# Patient Record
Sex: Male | Born: 1945 | Race: White | Hispanic: No | Smoking: Former smoker
Health system: Southern US, Community
[De-identification: ages and names within clinical notes are randomized; demographics above are authoritative.]

## PROBLEM LIST (undated history)

## (undated) DIAGNOSIS — M1990 Unspecified osteoarthritis, unspecified site: Secondary | ICD-10-CM

## (undated) DIAGNOSIS — K219 Gastro-esophageal reflux disease without esophagitis: Secondary | ICD-10-CM

## (undated) DIAGNOSIS — I10 Essential (primary) hypertension: Secondary | ICD-10-CM

## (undated) DIAGNOSIS — Z8719 Personal history of other diseases of the digestive system: Secondary | ICD-10-CM

## (undated) DIAGNOSIS — E559 Vitamin D deficiency, unspecified: Secondary | ICD-10-CM

## (undated) DIAGNOSIS — I219 Acute myocardial infarction, unspecified: Secondary | ICD-10-CM

## (undated) DIAGNOSIS — G4730 Sleep apnea, unspecified: Secondary | ICD-10-CM

## (undated) DIAGNOSIS — I2510 Atherosclerotic heart disease of native coronary artery without angina pectoris: Secondary | ICD-10-CM

## (undated) DIAGNOSIS — E119 Type 2 diabetes mellitus without complications: Secondary | ICD-10-CM

## (undated) HISTORY — DX: Atherosclerotic heart disease of native coronary artery without angina pectoris: I25.10

## (undated) HISTORY — PX: CARDIAC CATHETERIZATION: SHX172

## (undated) HISTORY — PX: TONSILLECTOMY: SUR1361

## (undated) HISTORY — DX: Type 2 diabetes mellitus without complications: E11.9

## (undated) HISTORY — PX: SHOULDER ARTHROSCOPY WITH ROTATOR CUFF REPAIR: SHX5685

## (undated) HISTORY — DX: Essential (primary) hypertension: I10

## (undated) HISTORY — DX: Unspecified osteoarthritis, unspecified site: M19.90

## (undated) HISTORY — DX: Vitamin D deficiency, unspecified: E55.9

## (undated) HISTORY — DX: Acute myocardial infarction, unspecified: I21.9

---

## 2020-12-26 ENCOUNTER — Encounter: Payer: Self-pay | Admitting: Surgery

## 2020-12-28 ENCOUNTER — Other Ambulatory Visit: Payer: Self-pay | Admitting: *Deleted

## 2020-12-28 ENCOUNTER — Other Ambulatory Visit: Payer: Self-pay

## 2020-12-28 ENCOUNTER — Encounter: Payer: Self-pay | Admitting: Surgery

## 2020-12-28 ENCOUNTER — Institutional Professional Consult (permissible substitution) (INDEPENDENT_AMBULATORY_CARE_PROVIDER_SITE_OTHER): Payer: No Typology Code available for payment source | Admitting: Surgery

## 2020-12-28 VITALS — BP 109/73 | HR 74 | Resp 20 | Ht 68.0 in | Wt 196.0 lb

## 2020-12-28 DIAGNOSIS — I2510 Atherosclerotic heart disease of native coronary artery without angina pectoris: Secondary | ICD-10-CM | POA: Diagnosis not present

## 2020-12-28 NOTE — Progress Notes (Signed)
Cardiothoracic Surgery Consultation   [facility] Referring Cardiologist is Daryel November, MD  Chief Complaint  Patient presents with  . Coronary Artery Disease    New patient consultation review all studies    HPI:  The patient is a 75 year old gentleman with history of hypertension, type 2 diabetes, and coronary artery disease status post myocardial infarction and remote stenting in 2000 and 2002 although the details of those procedures are unknown.  He now presents with a 69-month history of exertional substernal chest pain that he described as sharp and stabbing and associated with exertion.  This has been progressing over the past several months and occurring on a daily basis.  He was evaluated at [facility] in January with chest pain and ruled out for myocardial infarction.  He was discharged from the emergency room and subsequently had a Cardiolite stress test showing inferior, lateral, and apical ischemia with possible lateral infarct.  He subsequently underwent cardiac catheterization by Dr. Hyacinth Meeker on 12/15/2020.  This showed severe three-vessel coronary disease.  The LAD had mild proximal disease and then was diffusely diseased after the first diagonal branch.  The left circumflex had proximal 99% stenosis at the first marginal branch.  Second marginal was large and had 75 to 80% stenosis.  The right coronary artery was a dominant vessel with 25 to 30% proximal stenosis and distal 75% stenosis.  Left ventriculogram showed low normal left ventricular ejection fraction.  The patient is here today with his brother.  His wife is conferenced and on the telephone.  She is not in the best health and he takes care of her at home.  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  He receives most of his care at the [place] in [place] or [place] and sees Dr. Hyacinth Meeker for his cardiology care.  Past Medical  History:  Diagnosis Date  . Coronary atherosclerosis   . Degenerative joint disease   . Diabetes mellitus without complication (HCC)    type 2  . Hypertension   . Myocardial infarction (HCC)    stents placed  . Vitamin D deficiency     Past Surgical History:  Procedure Laterality Date  . CARDIAC CATHETERIZATION      Family History  Problem Relation Age of Onset  . Heart disease Father   . Diabetes Father   . Stroke Father   . Hypertension Father     Social History Social History   Tobacco Use  . Smoking status: Former Games developer  . Smokeless tobacco: Never Used  Substance Use Topics  . Alcohol use: Not Currently  . Drug use: Never    Current Outpatient Medications  Medication Sig Dispense Refill  . empagliflozin (JARDIANCE) 25 MG TABS tablet Take 25 mg by mouth daily.    Marland Kitchen ezetimibe (ZETIA) 10 MG tablet Take 10 mg by mouth daily.    . insulin aspart (NOVOLOG) 100 UNIT/ML injection Inject 10 Units into the skin 3 (three) times daily before meals.    . insulin glargine (LANTUS) 100 UNIT/ML injection Inject 25 Units into the skin daily. Every AM    . metoprolol tartrate (LOPRESSOR) 25 MG tablet Take 25 mg by mouth 2 (two) times daily.    . metFORMIN (GLUCOPHAGE) 1000 MG tablet Take 1,000 mg by mouth 2 (two) times daily with a meal. (Patient not taking: Reported on 12/28/2020)  No current facility-administered medications for this visit.    No Known Allergies  Review of Systems  Constitutional: Positive for activity change and fatigue.  HENT: Positive for hearing loss.        Has hearing aids  Eyes: Negative.   Respiratory: Positive for shortness of breath.   Cardiovascular: Positive for chest pain. Negative for leg swelling.  Gastrointestinal: Negative.        Hiatal hernia  Endocrine: Negative.   Genitourinary: Negative.   Musculoskeletal: Positive for arthralgias.  Skin: Negative.   Neurological: Negative.  Negative for dizziness and syncope.   Hematological: Negative.   Psychiatric/Behavioral: Negative.     BP 109/73 (BP Location: Left Arm, Patient Position: Sitting)   Pulse 74   Resp 20   Ht 5\' 8"  (1.727 m)   Wt 196 lb (88.9 kg)   SpO2 96% Comment: RA  BMI 29.80 kg/m  Physical Exam Constitutional:      Appearance: Normal appearance. He is normal weight.  HENT:     Head: Normocephalic and atraumatic.     Nose: Nose normal.  Eyes:     Extraocular Movements: Extraocular movements intact.     Conjunctiva/sclera: Conjunctivae normal.     Pupils: Pupils are equal, round, and reactive to light.  Neck:     Vascular: No carotid bruit.  Cardiovascular:     Rate and Rhythm: Normal rate and regular rhythm.     Pulses: Normal pulses.     Heart sounds: Normal heart sounds. No murmur heard.   Pulmonary:     Effort: Pulmonary effort is normal.     Breath sounds: Normal breath sounds.  Abdominal:     General: Abdomen is flat.     Palpations: Abdomen is soft.  Musculoskeletal:        General: No swelling. Normal range of motion.     Cervical back: Normal range of motion and neck supple.  Skin:    General: Skin is warm and dry.  Neurological:     General: No focal deficit present.     Mental Status: He is alert and oriented to person, place, and time.  Psychiatric:        Mood and Affect: Mood normal.        Behavior: Behavior normal.      Diagnostic Tests:  Cardiac catheterization from 12/15/2020 done at [facility] was reviewed on disc  Impression:  This 75 year old gentleman has severe three-vessel coronary disease with progressive anginal symptoms.  His left ventricular function is normal.  Given his history of diabetes and multivessel coronary disease I agree that coronary bypass graft surgery is the best treatment for his coronary disease. I discussed the operative procedure with the patient and his brother including alternatives, benefits and risks; including but not limited to bleeding, blood  transfusion, infection, stroke, myocardial infarction, graft failure, heart block requiring a permanent pacemaker, organ dysfunction, and death.  Novato Grosser understands and agrees to proceed.    Plan:  He will be scheduled for coronary bypass graft surgery on Monday, 01/01/2021.  He has been instructed to stop Metformin after his dose on 12/29/2020.  I spent 60 minutes performing this consultation and > 50% of this time was spent face to face counseling and coordinating the care of this patient's severe multivessel coronary disease.  12/31/2020, MD [facility] [phone]

## 2020-12-29 ENCOUNTER — Encounter (HOSPITAL_COMMUNITY)
Admission: RE | Admit: 2020-12-29 | Discharge: 2020-12-29 | Disposition: A | Discharge status: Home / Self Care | Payer: No Typology Code available for payment source | Source: Ambulatory Visit | Attending: Surgery | Admitting: Surgery

## 2020-12-29 ENCOUNTER — Ambulatory Visit (HOSPITAL_COMMUNITY)
Admission: RE | Admit: 2020-12-29 | Discharge: 2020-12-29 | Disposition: A | Discharge status: Home / Self Care | Payer: No Typology Code available for payment source | Source: Ambulatory Visit | Attending: Surgery | Admitting: Surgery

## 2020-12-29 ENCOUNTER — Ambulatory Visit (HOSPITAL_BASED_OUTPATIENT_CLINIC_OR_DEPARTMENT_OTHER)
Admission: RE | Admit: 2020-12-29 | Discharge: 2020-12-29 | Disposition: A | Discharge status: Home / Self Care | Payer: No Typology Code available for payment source | Source: Ambulatory Visit | Attending: Surgery | Admitting: Surgery

## 2020-12-29 ENCOUNTER — Other Ambulatory Visit (HOSPITAL_COMMUNITY)
Admission: RE | Admit: 2020-12-29 | Discharge: 2020-12-29 | Disposition: A | Discharge status: Home / Self Care | Payer: No Typology Code available for payment source | Source: Ambulatory Visit

## 2020-12-29 ENCOUNTER — Encounter (HOSPITAL_COMMUNITY): Payer: Self-pay | Admitting: Surgery

## 2020-12-29 ENCOUNTER — Other Ambulatory Visit: Payer: Self-pay

## 2020-12-29 ENCOUNTER — Encounter (HOSPITAL_COMMUNITY): Payer: Self-pay

## 2020-12-29 DIAGNOSIS — I10 Essential (primary) hypertension: Secondary | ICD-10-CM | POA: Insufficient documentation

## 2020-12-29 DIAGNOSIS — E119 Type 2 diabetes mellitus without complications: Secondary | ICD-10-CM | POA: Insufficient documentation

## 2020-12-29 DIAGNOSIS — Z01818 Encounter for other preprocedural examination: Secondary | ICD-10-CM | POA: Insufficient documentation

## 2020-12-29 DIAGNOSIS — I2510 Atherosclerotic heart disease of native coronary artery without angina pectoris: Secondary | ICD-10-CM

## 2020-12-29 DIAGNOSIS — I440 Atrioventricular block, first degree: Secondary | ICD-10-CM | POA: Insufficient documentation

## 2020-12-29 DIAGNOSIS — Z20822 Contact with and (suspected) exposure to covid-19: Secondary | ICD-10-CM | POA: Insufficient documentation

## 2020-12-29 HISTORY — DX: Gastro-esophageal reflux disease without esophagitis: K21.9

## 2020-12-29 HISTORY — DX: Sleep apnea, unspecified: G47.30

## 2020-12-29 HISTORY — DX: Personal history of other diseases of the digestive system: Z87.19

## 2020-12-29 LAB — TYPE AND SCREEN
ABO/RH(D): A POS
Antibody Screen: NEGATIVE

## 2020-12-29 LAB — COMPREHENSIVE METABOLIC PANEL
ALT: 15 U/L (ref 0–44)
AST: 17 U/L (ref 15–41)
Albumin: 4 g/dL (ref 3.5–5.0)
Alkaline Phosphatase: 72 U/L (ref 38–126)
Anion gap: 9 (ref 5–15)
BUN: 12 mg/dL (ref 8–23)
CO2: 23 mmol/L (ref 22–32)
Calcium: 9.3 mg/dL (ref 8.9–10.3)
Chloride: 104 mmol/L (ref 98–111)
Creatinine, Ser: 0.6 mg/dL — ABNORMAL LOW (ref 0.61–1.24)
GFR, Estimated: >60 mL/min (ref >60)
Glucose, Bld: 77 mg/dL (ref 70–99)
Potassium: 3.6 mmol/L (ref 3.5–5.1)
Sodium: 136 mmol/L (ref 135–145)
Total Bilirubin: 0.8 mg/dL (ref 0.3–1.2)
Total Protein: 7 g/dL (ref 6.5–8.1)

## 2020-12-29 LAB — URINALYSIS, ROUTINE W REFLEX MICROSCOPIC
Bacteria, UA: NONE SEEN
Bilirubin Urine: NEGATIVE
Glucose, UA: >=500 mg/dL — AB
Hgb urine dipstick: NEGATIVE
Ketones, ur: NEGATIVE mg/dL
Leukocytes,Ua: NEGATIVE
Nitrite: NEGATIVE
Protein, ur: NEGATIVE mg/dL
Specific Gravity, Urine: 1.017 (ref 1.005–1.030)
pH: 6 (ref 5.0–8.0)

## 2020-12-29 LAB — BLOOD GAS, ARTERIAL
Acid-Base Excess: 1.2 mmol/L (ref 0.0–2.0)
Bicarbonate: 25.5 mmol/L (ref 20.0–28.0)
Drawn by: 164
FIO2: 21
O2 Saturation: 97.4 %
Patient temperature: 37
pCO2 arterial: 42.3 mmHg (ref 32.0–48.0)
pH, Arterial: 7.397 (ref 7.350–7.450)
pO2, Arterial: 96.1 mmHg (ref 83.0–108.0)

## 2020-12-29 LAB — CBC
HCT: 42.8 % (ref 39.0–52.0)
Hemoglobin: 13.9 g/dL (ref 13.0–17.0)
MCH: 27.5 pg (ref 26.0–34.0)
MCHC: 32.5 g/dL (ref 30.0–36.0)
MCV: 84.6 fL (ref 80.0–100.0)
Platelets: 367 10*3/uL (ref 150–400)
RBC: 5.06 MIL/uL (ref 4.22–5.81)
RDW: 13.2 % (ref 11.5–15.5)
WBC: 11 10*3/uL — ABNORMAL HIGH (ref 4.0–10.5)
nRBC: 0 % (ref 0.0–0.2)

## 2020-12-29 LAB — HEMOGLOBIN A1C
Hgb A1c MFr Bld: 8.5 % — ABNORMAL HIGH (ref 4.8–5.6)
Mean Plasma Glucose: 197.25 mg/dL

## 2020-12-29 LAB — GLUCOSE, CAPILLARY: Glucose-Capillary: 85 mg/dL (ref 70–99)

## 2020-12-29 LAB — SURGICAL PCR SCREEN
MRSA, PCR: NEGATIVE
Staphylococcus aureus: NEGATIVE

## 2020-12-29 LAB — APTT: aPTT: 33 seconds (ref 24–36)

## 2020-12-29 LAB — PROTIME-INR
INR: 1.1 (ref 0.8–1.2)
Prothrombin Time: 13.4 seconds (ref 11.4–15.2)

## 2020-12-29 MED ORDER — PLASMA-LYTE 148 IV SOLN
INTRAVENOUS | Status: DC
  Filled 2020-12-29: qty 2.5

## 2020-12-29 MED ORDER — SODIUM CHLORIDE 0.9 % IV SOLN
1.5000 g | INTRAVENOUS | Status: AC
  Administered 2021-01-01: 1.5 g via INTRAVENOUS
  Filled 2020-12-29: qty 1.5

## 2020-12-29 MED ORDER — EPINEPHRINE HCL 5 MG/250ML IV SOLN IN NS
0.0000 ug/min | INTRAVENOUS | Status: DC
  Filled 2020-12-29: qty 250

## 2020-12-29 MED ORDER — INSULIN REGULAR(HUMAN) IN NACL 100-0.9 UT/100ML-% IV SOLN
INTRAVENOUS | Status: AC
  Administered 2021-01-01: 1 [IU]/h via INTRAVENOUS
  Filled 2020-12-29: qty 100

## 2020-12-29 MED ORDER — NOREPINEPHRINE 4 MG/250ML-% IV SOLN
0.0000 ug/min | INTRAVENOUS | Status: DC
  Filled 2020-12-29: qty 250

## 2020-12-29 MED ORDER — POTASSIUM CHLORIDE 2 MEQ/ML IV SOLN
80.0000 meq | INTRAVENOUS | Status: DC
  Filled 2020-12-29: qty 40

## 2020-12-29 MED ORDER — NITROGLYCERIN IN D5W 200-5 MCG/ML-% IV SOLN
2.0000 ug/min | INTRAVENOUS | Status: AC
  Administered 2021-01-01: 5 ug/min via INTRAVENOUS
  Filled 2020-12-29: qty 250

## 2020-12-29 MED ORDER — PHENYLEPHRINE HCL-NACL 20-0.9 MG/250ML-% IV SOLN
30.0000 ug/min | INTRAVENOUS | Status: AC
  Administered 2021-01-01: 20 ug/min via INTRAVENOUS
  Filled 2020-12-29: qty 250

## 2020-12-29 MED ORDER — VANCOMYCIN HCL 1500 MG/300ML IV SOLN
1500.0000 mg | INTRAVENOUS | Status: AC
  Administered 2021-01-01: 1500 mg via INTRAVENOUS
  Filled 2020-12-29: qty 300

## 2020-12-29 MED ORDER — SODIUM CHLORIDE 0.9 % IV SOLN
750.0000 mg | INTRAVENOUS | Status: AC
  Administered 2021-01-01: 750 mg via INTRAVENOUS
  Filled 2020-12-29: qty 750

## 2020-12-29 MED ORDER — MAGNESIUM SULFATE 50 % IJ SOLN
40.0000 meq | INTRAMUSCULAR | Status: DC
  Filled 2020-12-29: qty 9.85

## 2020-12-29 MED ORDER — TRANEXAMIC ACID (OHS) PUMP PRIME SOLUTION
2.0000 mg/kg | INTRAVENOUS | Status: DC
  Filled 2020-12-29: qty 1.78

## 2020-12-29 MED ORDER — MILRINONE LACTATE IN DEXTROSE 20-5 MG/100ML-% IV SOLN
0.3000 ug/kg/min | INTRAVENOUS | Status: DC
  Filled 2020-12-29: qty 100

## 2020-12-29 MED ORDER — SODIUM CHLORIDE 0.9 % IV SOLN
INTRAVENOUS | Status: DC
  Filled 2020-12-29: qty 30

## 2020-12-29 MED ORDER — TRANEXAMIC ACID 1000 MG/10ML IV SOLN
1.5000 mg/kg/h | INTRAVENOUS | Status: AC
  Administered 2021-01-01: 1.5 mg/kg/h via INTRAVENOUS
  Filled 2020-12-29: qty 25

## 2020-12-29 MED ORDER — DEXMEDETOMIDINE HCL IN NACL 400 MCG/100ML IV SOLN
0.1000 ug/kg/h | INTRAVENOUS | Status: AC
  Administered 2021-01-01: .4 ug/kg/h via INTRAVENOUS
  Filled 2020-12-29: qty 100

## 2020-12-29 MED ORDER — TRANEXAMIC ACID (OHS) BOLUS VIA INFUSION
15.0000 mg/kg | INTRAVENOUS | Status: AC
  Administered 2021-01-01: 1333.5 mg via INTRAVENOUS
  Filled 2020-12-29: qty 1334

## 2020-12-29 NOTE — Progress Notes (Signed)
Surgical Instructions    Your procedure is scheduled on Monday, March 21st, 2022  Report to [facility] Main Entrance "A" at 05:30 A.M., then check in with the Admitting office.  Call this number if you have problems the morning of surgery:  [phone]   If you have any questions prior to your surgery date call [phone]: Open Monday-Friday 8am-4pm    Remember:  Do not eat or drink after midnight the night before your surgery     Take these medicines the morning of surgery with A SIP OF WATER   acetaminophen (TYLENOL)  ezetimibe (ZETIA) metoprolol tartrate (LOPRESSOR) omeprazole (PRILOSEC) amoxicillin (AMOXIL)     Take this medicines if needed the morning of surgery  nitroGLYCERIN (NITROSTAT)-please let a nurse know if you had to use this.   Follow your surgeon's instructions on when to stop Aspirin.  If no instructions were given by your surgeon then you will need to call the office to get those instructions.     As of today, STOP taking Aleve, Naproxen, Ibuprofen, Motrin, Advil, Goody's, BC's, all herbal medications, fish oil, and all vitamins.  . WHAT DO I DO ABOUT MY DIABETES MEDICATION?   Marland Kitchen Do not take empagliflozin (JARDIANCE) the day before surgery and the day of surgery.   . Do not take metFORMIN (GLUCOPHAGE) on the day of surgery  . THE NIGHT BEFORE SURGERY, take _9__units of insulin aspart protamine- aspart (NOVOLOG MIX 70/30).      . THE MORNING OF SURGERY, take _26_ units of insulin glargine (LANTUS).   HOW TO MANAGE YOUR DIABETES BEFORE AND AFTER SURGERY  Why is it important to control my blood sugar before and after surgery? . Improving blood sugar levels before and after surgery helps healing and can limit problems. . A way of improving blood sugar control is eating a healthy diet by: o  Eating less sugar and carbohydrates o  Increasing activity/exercise o  Talking with your doctor about reaching your blood sugar goals . High blood sugars  (greater than 180 mg/dL) can raise your risk of infections and slow your recovery, so you will need to focus on controlling your diabetes during the weeks before surgery. . Make sure that the doctor who takes care of your diabetes knows about your planned surgery including the date and location.  How do I manage my blood sugar before surgery? . Check your blood sugar at least 4 times a day, starting 2 days before surgery, to make sure that the level is not too high or low. . Check your blood sugar the morning of your surgery when you wake up and every 2 hours until you get to the Short Stay unit. o If your blood sugar is less than 70 mg/dL, you will need to treat for low blood sugar: - Do not take insulin. - Treat a low blood sugar (less than 70 mg/dL) with  cup of clear juice (cranberry or apple), 4 glucose tablets, OR glucose gel. - Recheck blood sugar in 15 minutes after treatment (to make sure it is greater than 70 mg/dL). If your blood sugar is not greater than 70 mg/dL on recheck, call [phone] for further instructions. . Report your blood sugar to the short stay nurse when you get to Short Stay.  . If you are admitted to the hospital after surgery: o Your blood sugar will be checked by the staff and you will probably be given insulin after surgery (instead of oral diabetes medicines) to make sure  you have good blood sugar levels. o The goal for blood sugar control after surgery is 80-180 mg/dL.                      Do not wear jewelry.             Do not wear lotions, powders, colognes, or deodorant.            Men may shave face and neck.            Do not bring valuables to the hospital.            [facility] is not responsible for any belongings or valuables.  Do NOT Smoke (Tobacco/Vaping) or drink Alcohol 24 hours prior to your procedure If you use a CPAP at night, you may bring all equipment for your overnight stay.   Contacts, glasses, dentures or bridgework may not be  worn into surgery, please bring cases for these belongings   For patients admitted to the hospital, discharge time will be determined by your treatment team.   Patients discharged the day of surgery will not be allowed to drive home, and someone needs to stay with them for 24 hours.    Special instructions:   [place]- Preparing For Surgery  Before surgery, you can play an important role. Because skin is not sterile, your skin needs to be as free of germs as possible. You can reduce the number of germs on your skin by washing with CHG (chlorahexidine gluconate) Soap before surgery.  CHG is an antiseptic cleaner which kills germs and bonds with the skin to continue killing germs even after washing.    Oral Hygiene is also important to reduce your risk of infection.  Remember - BRUSH YOUR TEETH THE MORNING OF SURGERY WITH YOUR REGULAR TOOTHPASTE  Please do not use if you have an allergy to CHG or antibacterial soaps. If your skin becomes reddened/irritated stop using the CHG.  Do not shave (including legs and underarms) for at least 48 hours prior to first CHG shower. It is OK to shave your face.  Please follow these instructions carefully.   1. Shower the NIGHT BEFORE SURGERY and the MORNING OF SURGERY  2. If you chose to wash your hair, wash your hair first as usual with your normal shampoo.  3. After you shampoo, rinse your hair and body thoroughly to remove the shampoo.  4. Use CHG Soap as you would any other liquid soap. You can apply CHG directly to the skin and wash gently with a scrungie or a clean washcloth.   5. Apply the CHG Soap to your body ONLY FROM THE NECK DOWN.  Do not use on open wounds or open sores. Avoid contact with your eyes, ears, mouth and genitals (private parts). Wash Face and genitals (private parts)  with your normal soap.   6. Wash thoroughly, paying special attention to the area where your surgery will be performed.  7. Thoroughly rinse your body with  warm water from the neck down.  8. DO NOT shower/wash with your normal soap after using and rinsing off the CHG Soap.  9. Pat yourself dry with a CLEAN TOWEL.  10. Wear CLEAN PAJAMAS to bed the night before surgery  11. Place CLEAN SHEETS on your bed the night before your surgery  12. DO NOT SLEEP WITH PETS.   Day of Surgery: Shower with CHG soap. Wear Clean/Comfortable clothing the morning of surgery Do  not apply any deodorants/lotions.   Remember to brush your teeth WITH YOUR REGULAR TOOTHPASTE.   Please read over the following fact sheets that you were given.

## 2020-12-29 NOTE — Progress Notes (Signed)
[facility]P [facility] [place], [place] Cardiologist - Dr. Daryel November  Chest x-ray - 12/29/20 EKG - 12/29/20 Stress Test - 11/2020 ECHO - pt unsure of exact date Cardiac Cath -12/15/20-[facility]   Sleep Study - OSA+ CPAP - uses nightly  Fasting Blood Sugar - 90-100 Checks Blood Sugar 3 times a day  Blood Thinner Instructions: n/a Aspirin Instructions: continue; none DOS  COVID TEST- 12/29/20 in PAT; pt will quarantine until surgery.  Anesthesia review: Yes, heart history.  Patient denies shortness of breath, fever, cough and chest pain at PAT appointment   All instructions explained to the patient, with a verbal understanding of the material. Patient agrees to go over the instructions while at home for a better understanding. Patient also instructed to self quarantine after being tested for COVID-19. The opportunity to ask questions was provided.

## 2020-12-29 NOTE — Progress Notes (Signed)
Surgical Instructions    Your procedure is scheduled on Monday, March 21st, 2022  Report to [facility] Main Entrance "A" at 05:30 A.M., then check in with the Admitting office.  Call this number if you have problems the morning of surgery:  [phone]   If you have any questions prior to your surgery date call [phone]: Open Monday-Friday 8am-4pm    Remember:  Do not eat or drink after midnight the night before your surgery     Take these medicines the morning of surgery with A SIP OF WATER   acetaminophen (TYLENOL)  ezetimibe (ZETIA) metoprolol tartrate (LOPRESSOR) omeprazole (PRILOSEC) amoxicillin (AMOXIL)  losartan (COZAAR)    Take this medicines if needed the morning of surgery  nitroGLYCERIN (NITROSTAT)   Follow your surgeon's instructions on when to stop Aspirin.  If no instructions were given by your surgeon then you will need to call the office to get those instructions.     As of today, STOP taking Aleve, Naproxen, Ibuprofen, Motrin, Advil, Goody's, BC's, all herbal medications, fish oil, and all vitamins.  . WHAT DO I DO ABOUT MY DIABETES MEDICATION?   Marland Kitchen Do not take empagliflozin (JARDIANCE) the day before surgery and the day of surgery.   . Do not take metFORMIN (GLUCOPHAGE) on the day of surgery  . THE NIGHT BEFORE SURGERY, take _9__units of insulin aspart protamine- aspart (NOVOLOG MIX 70/30).     . THE MORNING BEFORE SURGERY AND THE MORNING OF SURGERY, take _26_ units of insulin glargine (LANTUS).  . The day of surgery, do not take other diabetes injectables, including Byetta (exenatide), Bydureon (exenatide ER), Victoza (liraglutide), or Trulicity (dulaglutide).  . If your CBG is greater than 220 mg/dL, you may take  of your sliding scale (correction) dose of insulin.   HOW TO MANAGE YOUR DIABETES BEFORE AND AFTER SURGERY  Why is it important to control my blood sugar before and after surgery? . Improving blood sugar levels before and after  surgery helps healing and can limit problems. . A way of improving blood sugar control is eating a healthy diet by: o  Eating less sugar and carbohydrates o  Increasing activity/exercise o  Talking with your doctor about reaching your blood sugar goals . High blood sugars (greater than 180 mg/dL) can raise your risk of infections and slow your recovery, so you will need to focus on controlling your diabetes during the weeks before surgery. . Make sure that the doctor who takes care of your diabetes knows about your planned surgery including the date and location.  How do I manage my blood sugar before surgery? . Check your blood sugar at least 4 times a day, starting 2 days before surgery, to make sure that the level is not too high or low. . Check your blood sugar the morning of your surgery when you wake up and every 2 hours until you get to the Short Stay unit. o If your blood sugar is less than 70 mg/dL, you will need to treat for low blood sugar: - Do not take insulin. - Treat a low blood sugar (less than 70 mg/dL) with  cup of clear juice (cranberry or apple), 4 glucose tablets, OR glucose gel. - Recheck blood sugar in 15 minutes after treatment (to make sure it is greater than 70 mg/dL). If your blood sugar is not greater than 70 mg/dL on recheck, call [phone] for further instructions. . Report your blood sugar to the short stay nurse when you get  to Short Stay.  . If you are admitted to the hospital after surgery: o Your blood sugar will be checked by the staff and you will probably be given insulin after surgery (instead of oral diabetes medicines) to make sure you have good blood sugar levels. o The goal for blood sugar control after surgery is 80-180 mg/dL.                      Do not wear jewelry, make up, or nail polish            Do not wear lotions, powders, perfumes, or deodorant.            Men may shave face and neck.            Do not bring valuables to the  hospital.            [facility] is not responsible for any belongings or valuables.  Do NOT Smoke (Tobacco/Vaping) or drink Alcohol 24 hours prior to your procedure If you use a CPAP at night, you may bring all equipment for your overnight stay.   Contacts, glasses, dentures or bridgework may not be worn into surgery, please bring cases for these belongings   For patients admitted to the hospital, discharge time will be determined by your treatment team.   Patients discharged the day of surgery will not be allowed to drive home, and someone needs to stay with them for 24 hours.    Special instructions:   [place]- Preparing For Surgery  Before surgery, you can play an important role. Because skin is not sterile, your skin needs to be as free of germs as possible. You can reduce the number of germs on your skin by washing with CHG (chlorahexidine gluconate) Soap before surgery.  CHG is an antiseptic cleaner which kills germs and bonds with the skin to continue killing germs even after washing.    Oral Hygiene is also important to reduce your risk of infection.  Remember - BRUSH YOUR TEETH THE MORNING OF SURGERY WITH YOUR REGULAR TOOTHPASTE  Please do not use if you have an allergy to CHG or antibacterial soaps. If your skin becomes reddened/irritated stop using the CHG.  Do not shave (including legs and underarms) for at least 48 hours prior to first CHG shower. It is OK to shave your face.  Please follow these instructions carefully.   1. Shower the NIGHT BEFORE SURGERY and the MORNING OF SURGERY  2. If you chose to wash your hair, wash your hair first as usual with your normal shampoo.  3. After you shampoo, rinse your hair and body thoroughly to remove the shampoo.  4. Use CHG Soap as you would any other liquid soap. You can apply CHG directly to the skin and wash gently with a scrungie or a clean washcloth.   5. Apply the CHG Soap to your body ONLY FROM THE NECK DOWN.  Do not  use on open wounds or open sores. Avoid contact with your eyes, ears, mouth and genitals (private parts). Wash Face and genitals (private parts)  with your normal soap.   6. Wash thoroughly, paying special attention to the area where your surgery will be performed.  7. Thoroughly rinse your body with warm water from the neck down.  8. DO NOT shower/wash with your normal soap after using and rinsing off the CHG Soap.  9. Pat yourself dry with a CLEAN TOWEL.  10. Wear  CLEAN PAJAMAS to bed the night before surgery  11. Place CLEAN SHEETS on your bed the night before your surgery  12. DO NOT SLEEP WITH PETS.   Day of Surgery: Shower with CHG soap. Wear Clean/Comfortable clothing the morning of surgery Do not apply any deodorants/lotions.   Remember to brush your teeth WITH YOUR REGULAR TOOTHPASTE.   Please read over the following fact sheets that you were given.

## 2020-12-30 LAB — SARS CORONAVIRUS 2 (TAT 6-24 HRS)
SARS Coronavirus 2: NEGATIVE
SARS Coronavirus 2: NEGATIVE

## 2020-12-31 NOTE — Anesthesia Preprocedure Evaluation (Addendum)
Anesthesia Evaluation  Patient identified by MRN, date of birth, ID band Patient awake    Reviewed: Allergy & Precautions, NPO status , Patient's Chart, lab work & pertinent test results, reviewed documented beta blocker date and time   Airway Mallampati: II  TM Distance: <3 FB Neck ROM: Full    Dental  (+) Dental Advisory Given, Edentulous Lower, Poor Dentition   Pulmonary sleep apnea and Continuous Positive Airway Pressure Ventilation , former smoker,    Pulmonary exam normal breath sounds clear to auscultation       Cardiovascular hypertension, Pt. on home beta blockers and Pt. on medications + CAD, + Past MI and + Cardiac Stents  Normal cardiovascular exam Rhythm:Regular Rate:Normal     Neuro/Psych negative neurological ROS     GI/Hepatic Neg liver ROS, hiatal hernia, GERD  Medicated,  Endo/Other  diabetes, Type 2, Oral Hypoglycemic Agents, Insulin Dependent  Renal/GU negative Renal ROS     Musculoskeletal  (+) Arthritis ,   Abdominal   Peds  Hematology negative hematology ROS (+)   Anesthesia Other Findings   Reproductive/Obstetrics                            Anesthesia Physical Anesthesia Plan  ASA: IV  Anesthesia Plan: General   Post-op Pain Management:    Induction: Intravenous  PONV Risk Score and Plan: 2 and Treatment may vary due to age or medical condition and Midazolam  Airway Management Planned: Oral ETT and Video Laryngoscope Planned  Additional Equipment: Arterial line, CVP, TEE and Ultrasound Guidance Line Placement  Intra-op Plan:   Post-operative Plan: Post-operative intubation/ventilation  Informed Consent: I have reviewed the patients History and Physical, chart, labs and discussed the procedure including the risks, benefits and alternatives for the proposed anesthesia with the patient or authorized representative who has indicated his/her understanding and  acceptance.     Dental advisory given  Plan Discussed with: CRNA  Anesthesia Plan Comments:        Anesthesia Quick Evaluation

## 2021-01-01 ENCOUNTER — Inpatient Hospital Stay (HOSPITAL_COMMUNITY): Payer: No Typology Code available for payment source

## 2021-01-01 ENCOUNTER — Inpatient Hospital Stay (HOSPITAL_COMMUNITY): Payer: No Typology Code available for payment source | Admitting: Certified Registered Nurse Anesthetist

## 2021-01-01 ENCOUNTER — Encounter (HOSPITAL_COMMUNITY): Payer: Self-pay | Admitting: Surgery

## 2021-01-01 ENCOUNTER — Encounter: Payer: Self-pay | Admitting: Internal Medicine

## 2021-01-01 ENCOUNTER — Inpatient Hospital Stay (HOSPITAL_COMMUNITY)
Admission: RE | Admit: 2021-01-01 | Discharge: 2021-01-06 | DRG: 236 | Disposition: A | Discharge status: Home / Self Care | Payer: No Typology Code available for payment source | Attending: Surgery | Admitting: Surgery

## 2021-01-01 ENCOUNTER — Inpatient Hospital Stay (HOSPITAL_COMMUNITY)
Admission: RE | Disposition: A | Discharge status: Home / Self Care | Payer: Self-pay | Source: Home / Self Care | Attending: Surgery

## 2021-01-01 ENCOUNTER — Other Ambulatory Visit: Payer: Self-pay

## 2021-01-01 DIAGNOSIS — I4891 Unspecified atrial fibrillation: Secondary | ICD-10-CM | POA: Diagnosis not present

## 2021-01-01 DIAGNOSIS — E119 Type 2 diabetes mellitus without complications: Secondary | ICD-10-CM | POA: Diagnosis present

## 2021-01-01 DIAGNOSIS — E559 Vitamin D deficiency, unspecified: Secondary | ICD-10-CM | POA: Diagnosis present

## 2021-01-01 DIAGNOSIS — Z823 Family history of stroke: Secondary | ICD-10-CM

## 2021-01-01 DIAGNOSIS — I97190 Other postprocedural cardiac functional disturbances following cardiac surgery: Secondary | ICD-10-CM | POA: Diagnosis not present

## 2021-01-01 DIAGNOSIS — Y838 Other surgical procedures as the cause of abnormal reaction of the patient, or of later complication, without mention of misadventure at the time of the procedure: Secondary | ICD-10-CM | POA: Diagnosis not present

## 2021-01-01 DIAGNOSIS — Z79899 Other long term (current) drug therapy: Secondary | ICD-10-CM | POA: Diagnosis not present

## 2021-01-01 DIAGNOSIS — J9811 Atelectasis: Secondary | ICD-10-CM | POA: Diagnosis not present

## 2021-01-01 DIAGNOSIS — Z7984 Long term (current) use of oral hypoglycemic drugs: Secondary | ICD-10-CM | POA: Diagnosis not present

## 2021-01-01 DIAGNOSIS — G4730 Sleep apnea, unspecified: Secondary | ICD-10-CM | POA: Diagnosis present

## 2021-01-01 DIAGNOSIS — I252 Old myocardial infarction: Secondary | ICD-10-CM

## 2021-01-01 DIAGNOSIS — Z833 Family history of diabetes mellitus: Secondary | ICD-10-CM | POA: Diagnosis not present

## 2021-01-01 DIAGNOSIS — Z8249 Family history of ischemic heart disease and other diseases of the circulatory system: Secondary | ICD-10-CM | POA: Diagnosis not present

## 2021-01-01 DIAGNOSIS — D62 Acute posthemorrhagic anemia: Secondary | ICD-10-CM | POA: Diagnosis not present

## 2021-01-01 DIAGNOSIS — Z951 Presence of aortocoronary bypass graft: Secondary | ICD-10-CM

## 2021-01-01 DIAGNOSIS — K219 Gastro-esophageal reflux disease without esophagitis: Secondary | ICD-10-CM | POA: Diagnosis present

## 2021-01-01 DIAGNOSIS — I440 Atrioventricular block, first degree: Secondary | ICD-10-CM | POA: Diagnosis not present

## 2021-01-01 DIAGNOSIS — Z955 Presence of coronary angioplasty implant and graft: Secondary | ICD-10-CM

## 2021-01-01 DIAGNOSIS — M1990 Unspecified osteoarthritis, unspecified site: Secondary | ICD-10-CM | POA: Diagnosis present

## 2021-01-01 DIAGNOSIS — Z20822 Contact with and (suspected) exposure to covid-19: Secondary | ICD-10-CM | POA: Diagnosis present

## 2021-01-01 DIAGNOSIS — Z87891 Personal history of nicotine dependence: Secondary | ICD-10-CM | POA: Diagnosis not present

## 2021-01-01 DIAGNOSIS — I10 Essential (primary) hypertension: Secondary | ICD-10-CM | POA: Diagnosis present

## 2021-01-01 DIAGNOSIS — H9190 Unspecified hearing loss, unspecified ear: Secondary | ICD-10-CM | POA: Diagnosis present

## 2021-01-01 DIAGNOSIS — Z794 Long term (current) use of insulin: Secondary | ICD-10-CM | POA: Diagnosis not present

## 2021-01-01 DIAGNOSIS — J90 Pleural effusion, not elsewhere classified: Secondary | ICD-10-CM

## 2021-01-01 DIAGNOSIS — I2510 Atherosclerotic heart disease of native coronary artery without angina pectoris: Principal | ICD-10-CM | POA: Diagnosis present

## 2021-01-01 DIAGNOSIS — E8770 Fluid overload, unspecified: Secondary | ICD-10-CM | POA: Diagnosis not present

## 2021-01-01 DIAGNOSIS — J939 Pneumothorax, unspecified: Secondary | ICD-10-CM

## 2021-01-01 HISTORY — PX: CORONARY ARTERY BYPASS GRAFT: SHX141

## 2021-01-01 HISTORY — PX: TEE WITHOUT CARDIOVERSION: SHX5443

## 2021-01-01 LAB — POCT I-STAT 7, (LYTES, BLD GAS, ICA,H+H)
Acid-Base Excess: 3 mmol/L — ABNORMAL HIGH (ref 0.0–2.0)
Acid-Base Excess: 3 mmol/L — ABNORMAL HIGH (ref 0.0–2.0)
Acid-Base Excess: 2 mmol/L (ref 0.0–2.0)
Acid-Base Excess: 1 mmol/L (ref 0.0–2.0)
Acid-Base Excess: 0 mmol/L (ref 0.0–2.0)
Acid-base deficit: 4 mmol/L — ABNORMAL HIGH (ref 0.0–2.0)
Acid-base deficit: 4 mmol/L — ABNORMAL HIGH (ref 0.0–2.0)
Bicarbonate: 28.6 mmol/L — ABNORMAL HIGH (ref 20.0–28.0)
Bicarbonate: 28.5 mmol/L — ABNORMAL HIGH (ref 20.0–28.0)
Bicarbonate: 27 mmol/L (ref 20.0–28.0)
Bicarbonate: 25.4 mmol/L (ref 20.0–28.0)
Bicarbonate: 25 mmol/L (ref 20.0–28.0)
Bicarbonate: 23.1 mmol/L (ref 20.0–28.0)
Bicarbonate: 23.1 mmol/L (ref 20.0–28.0)
Calcium, Ion: 1.24 mmol/L (ref 1.15–1.40)
Calcium, Ion: 0.97 mmol/L — ABNORMAL LOW (ref 1.15–1.40)
Calcium, Ion: 1.12 mmol/L — ABNORMAL LOW (ref 1.15–1.40)
Calcium, Ion: 1.07 mmol/L — ABNORMAL LOW (ref 1.15–1.40)
Calcium, Ion: 1.16 mmol/L (ref 1.15–1.40)
Calcium, Ion: 1.12 mmol/L — ABNORMAL LOW (ref 1.15–1.40)
Calcium, Ion: 1.11 mmol/L — ABNORMAL LOW (ref 1.15–1.40)
HCT: 40 % (ref 39.0–52.0)
HCT: 28 % — ABNORMAL LOW (ref 39.0–52.0)
HCT: 30 % — ABNORMAL LOW (ref 39.0–52.0)
HCT: 32 % — ABNORMAL LOW (ref 39.0–52.0)
HCT: 35 % — ABNORMAL LOW (ref 39.0–52.0)
HCT: 33 % — ABNORMAL LOW (ref 39.0–52.0)
HCT: 31 % — ABNORMAL LOW (ref 39.0–52.0)
Hemoglobin: 13.6 g/dL (ref 13.0–17.0)
Hemoglobin: 9.5 g/dL — ABNORMAL LOW (ref 13.0–17.0)
Hemoglobin: 10.2 g/dL — ABNORMAL LOW (ref 13.0–17.0)
Hemoglobin: 10.9 g/dL — ABNORMAL LOW (ref 13.0–17.0)
Hemoglobin: 11.9 g/dL — ABNORMAL LOW (ref 13.0–17.0)
Hemoglobin: 11.2 g/dL — ABNORMAL LOW (ref 13.0–17.0)
Hemoglobin: 10.5 g/dL — ABNORMAL LOW (ref 13.0–17.0)
O2 Saturation: 100 %
O2 Saturation: 100 %
O2 Saturation: 100 %
O2 Saturation: 98 %
O2 Saturation: 99 %
O2 Saturation: 98 %
O2 Saturation: 96 %
Patient temperature: 36.4
Patient temperature: 37.6
Patient temperature: 37.7
Potassium: 4.2 mmol/L (ref 3.5–5.1)
Potassium: 4.2 mmol/L (ref 3.5–5.1)
Potassium: 5 mmol/L (ref 3.5–5.1)
Potassium: 4.6 mmol/L (ref 3.5–5.1)
Potassium: 3.7 mmol/L (ref 3.5–5.1)
Potassium: 5.1 mmol/L (ref 3.5–5.1)
Potassium: 4.8 mmol/L (ref 3.5–5.1)
Sodium: 138 mmol/L (ref 135–145)
Sodium: 141 mmol/L (ref 135–145)
Sodium: 139 mmol/L (ref 135–145)
Sodium: 140 mmol/L (ref 135–145)
Sodium: 141 mmol/L (ref 135–145)
Sodium: 140 mmol/L (ref 135–145)
Sodium: 139 mmol/L (ref 135–145)
TCO2: 30 mmol/L (ref 22–32)
TCO2: 30 mmol/L (ref 22–32)
TCO2: 28 mmol/L (ref 22–32)
TCO2: 27 mmol/L (ref 22–32)
TCO2: 26 mmol/L (ref 22–32)
TCO2: 25 mmol/L (ref 22–32)
TCO2: 24 mmol/L (ref 22–32)
pCO2 arterial: 47.9 mmHg (ref 32.0–48.0)
pCO2 arterial: 46.6 mmHg (ref 32.0–48.0)
pCO2 arterial: 42.3 mmHg (ref 32.0–48.0)
pCO2 arterial: 38.2 mmHg (ref 32.0–48.0)
pCO2 arterial: 42.3 mmHg (ref 32.0–48.0)
pCO2 arterial: 48.7 mmHg — ABNORMAL HIGH (ref 32.0–48.0)
pCO2 arterial: 48.9 mmHg — ABNORMAL HIGH (ref 32.0–48.0)
pH, Arterial: 7.385 (ref 7.350–7.450)
pH, Arterial: 7.395 (ref 7.350–7.450)
pH, Arterial: 7.413 (ref 7.350–7.450)
pH, Arterial: 7.431 (ref 7.350–7.450)
pH, Arterial: 7.377 (ref 7.350–7.450)
pH, Arterial: 7.287 — ABNORMAL LOW (ref 7.350–7.450)
pH, Arterial: 7.285 — ABNORMAL LOW (ref 7.350–7.450)
pO2, Arterial: 519 mmHg — ABNORMAL HIGH (ref 83.0–108.0)
pO2, Arterial: 375 mmHg — ABNORMAL HIGH (ref 83.0–108.0)
pO2, Arterial: 352 mmHg — ABNORMAL HIGH (ref 83.0–108.0)
pO2, Arterial: 101 mmHg (ref 83.0–108.0)
pO2, Arterial: 149 mmHg — ABNORMAL HIGH (ref 83.0–108.0)
pO2, Arterial: 117 mmHg — ABNORMAL HIGH (ref 83.0–108.0)
pO2, Arterial: 99 mmHg (ref 83.0–108.0)

## 2021-01-01 LAB — CBC
HCT: 36.8 % — ABNORMAL LOW (ref 39.0–52.0)
HCT: 34 % — ABNORMAL LOW (ref 39.0–52.0)
Hemoglobin: 12 g/dL — ABNORMAL LOW (ref 13.0–17.0)
Hemoglobin: 10.8 g/dL — ABNORMAL LOW (ref 13.0–17.0)
MCH: 27.4 pg (ref 26.0–34.0)
MCH: 27.3 pg (ref 26.0–34.0)
MCHC: 32.6 g/dL (ref 30.0–36.0)
MCHC: 31.8 g/dL (ref 30.0–36.0)
MCV: 84 fL (ref 80.0–100.0)
MCV: 85.9 fL (ref 80.0–100.0)
Platelets: 207 10*3/uL (ref 150–400)
Platelets: 230 10*3/uL (ref 150–400)
RBC: 4.38 MIL/uL (ref 4.22–5.81)
RBC: 3.96 MIL/uL — ABNORMAL LOW (ref 4.22–5.81)
RDW: 13.2 % (ref 11.5–15.5)
RDW: 13.2 % (ref 11.5–15.5)
WBC: 15.5 10*3/uL — ABNORMAL HIGH (ref 4.0–10.5)
WBC: 17.3 10*3/uL — ABNORMAL HIGH (ref 4.0–10.5)
nRBC: 0 % (ref 0.0–0.2)
nRBC: 0 % (ref 0.0–0.2)

## 2021-01-01 LAB — POCT I-STAT, CHEM 8
BUN: 17 mg/dL (ref 8–23)
BUN: 16 mg/dL (ref 8–23)
BUN: 14 mg/dL (ref 8–23)
BUN: 15 mg/dL (ref 8–23)
BUN: 15 mg/dL (ref 8–23)
BUN: 19 mg/dL (ref 8–23)
Calcium, Ion: 1.22 mmol/L (ref 1.15–1.40)
Calcium, Ion: 1.21 mmol/L (ref 1.15–1.40)
Calcium, Ion: 1.12 mmol/L — ABNORMAL LOW (ref 1.15–1.40)
Calcium, Ion: 1.14 mmol/L — ABNORMAL LOW (ref 1.15–1.40)
Calcium, Ion: 1.09 mmol/L — ABNORMAL LOW (ref 1.15–1.40)
Calcium, Ion: 1.07 mmol/L — ABNORMAL LOW (ref 1.15–1.40)
Chloride: 101 mmol/L (ref 98–111)
Chloride: 101 mmol/L (ref 98–111)
Chloride: 100 mmol/L (ref 98–111)
Chloride: 102 mmol/L (ref 98–111)
Chloride: 101 mmol/L (ref 98–111)
Chloride: 101 mmol/L (ref 98–111)
Creatinine, Ser: 0.4 mg/dL — ABNORMAL LOW (ref 0.61–1.24)
Creatinine, Ser: 0.5 mg/dL — ABNORMAL LOW (ref 0.61–1.24)
Creatinine, Ser: 0.3 mg/dL — ABNORMAL LOW (ref 0.61–1.24)
Creatinine, Ser: 0.4 mg/dL — ABNORMAL LOW (ref 0.61–1.24)
Creatinine, Ser: 0.5 mg/dL — ABNORMAL LOW (ref 0.61–1.24)
Creatinine, Ser: 0.4 mg/dL — ABNORMAL LOW (ref 0.61–1.24)
Glucose, Bld: 147 mg/dL — ABNORMAL HIGH (ref 70–99)
Glucose, Bld: 103 mg/dL — ABNORMAL HIGH (ref 70–99)
Glucose, Bld: 105 mg/dL — ABNORMAL HIGH (ref 70–99)
Glucose, Bld: 83 mg/dL (ref 70–99)
Glucose, Bld: 136 mg/dL — ABNORMAL HIGH (ref 70–99)
Glucose, Bld: 112 mg/dL — ABNORMAL HIGH (ref 70–99)
HCT: 41 % (ref 39.0–52.0)
HCT: 36 % — ABNORMAL LOW (ref 39.0–52.0)
HCT: 30 % — ABNORMAL LOW (ref 39.0–52.0)
HCT: 31 % — ABNORMAL LOW (ref 39.0–52.0)
HCT: 31 % — ABNORMAL LOW (ref 39.0–52.0)
HCT: 32 % — ABNORMAL LOW (ref 39.0–52.0)
Hemoglobin: 13.9 g/dL (ref 13.0–17.0)
Hemoglobin: 12.2 g/dL — ABNORMAL LOW (ref 13.0–17.0)
Hemoglobin: 10.2 g/dL — ABNORMAL LOW (ref 13.0–17.0)
Hemoglobin: 10.5 g/dL — ABNORMAL LOW (ref 13.0–17.0)
Hemoglobin: 10.5 g/dL — ABNORMAL LOW (ref 13.0–17.0)
Hemoglobin: 10.9 g/dL — ABNORMAL LOW (ref 13.0–17.0)
Potassium: 4.2 mmol/L (ref 3.5–5.1)
Potassium: 4 mmol/L (ref 3.5–5.1)
Potassium: 4.3 mmol/L (ref 3.5–5.1)
Potassium: 4.6 mmol/L (ref 3.5–5.1)
Potassium: 4.5 mmol/L (ref 3.5–5.1)
Potassium: 4.6 mmol/L (ref 3.5–5.1)
Sodium: 139 mmol/L (ref 135–145)
Sodium: 139 mmol/L (ref 135–145)
Sodium: 138 mmol/L (ref 135–145)
Sodium: 139 mmol/L (ref 135–145)
Sodium: 138 mmol/L (ref 135–145)
Sodium: 140 mmol/L (ref 135–145)
TCO2: 28 mmol/L (ref 22–32)
TCO2: 25 mmol/L (ref 22–32)
TCO2: 26 mmol/L (ref 22–32)
TCO2: 27 mmol/L (ref 22–32)
TCO2: 27 mmol/L (ref 22–32)
TCO2: 26 mmol/L (ref 22–32)

## 2021-01-01 LAB — COMPREHENSIVE METABOLIC PANEL
ALT: 14 U/L (ref 0–44)
AST: 24 U/L (ref 15–41)
Albumin: 3.6 g/dL (ref 3.5–5.0)
Alkaline Phosphatase: 54 U/L (ref 38–126)
Anion gap: 6 (ref 5–15)
BUN: 14 mg/dL (ref 8–23)
CO2: 22 mmol/L (ref 22–32)
Calcium: 7.5 mg/dL — ABNORMAL LOW (ref 8.9–10.3)
Chloride: 107 mmol/L (ref 98–111)
Creatinine, Ser: 0.67 mg/dL (ref 0.61–1.24)
GFR, Estimated: >60 mL/min (ref >60)
Glucose, Bld: 162 mg/dL — ABNORMAL HIGH (ref 70–99)
Potassium: 5 mmol/L (ref 3.5–5.1)
Sodium: 135 mmol/L (ref 135–145)
Total Bilirubin: 1.2 mg/dL (ref 0.3–1.2)
Total Protein: 5.6 g/dL — ABNORMAL LOW (ref 6.5–8.1)

## 2021-01-01 LAB — POCT I-STAT EG7
Acid-Base Excess: 2 mmol/L (ref 0.0–2.0)
Bicarbonate: 27.7 mmol/L (ref 20.0–28.0)
Calcium, Ion: 1.09 mmol/L — ABNORMAL LOW (ref 1.15–1.40)
HCT: 29 % — ABNORMAL LOW (ref 39.0–52.0)
Hemoglobin: 9.9 g/dL — ABNORMAL LOW (ref 13.0–17.0)
O2 Saturation: 83 %
Potassium: 4.6 mmol/L (ref 3.5–5.1)
Sodium: 139 mmol/L (ref 135–145)
TCO2: 29 mmol/L (ref 22–32)
pCO2, Ven: 48 mmHg (ref 44.0–60.0)
pH, Ven: 7.369 (ref 7.250–7.430)
pO2, Ven: 50 mmHg — ABNORMAL HIGH (ref 32.0–45.0)

## 2021-01-01 LAB — HEMOGLOBIN AND HEMATOCRIT, BLOOD
HCT: 30.7 % — ABNORMAL LOW (ref 39.0–52.0)
Hemoglobin: 10.1 g/dL — ABNORMAL LOW (ref 13.0–17.0)

## 2021-01-01 LAB — GLUCOSE, CAPILLARY
Glucose-Capillary: 73 mg/dL (ref 70–99)
Glucose-Capillary: 61 mg/dL — ABNORMAL LOW (ref 70–99)
Glucose-Capillary: 107 mg/dL — ABNORMAL HIGH (ref 70–99)
Glucose-Capillary: 81 mg/dL (ref 70–99)
Glucose-Capillary: 53 mg/dL — ABNORMAL LOW (ref 70–99)
Glucose-Capillary: 135 mg/dL — ABNORMAL HIGH (ref 70–99)
Glucose-Capillary: 169 mg/dL — ABNORMAL HIGH (ref 70–99)
Glucose-Capillary: 155 mg/dL — ABNORMAL HIGH (ref 70–99)

## 2021-01-01 LAB — PLATELET COUNT: Platelets: 192 10*3/uL (ref 150–400)

## 2021-01-01 LAB — PROTIME-INR
INR: 1.4 — ABNORMAL HIGH (ref 0.8–1.2)
Prothrombin Time: 16.2 seconds — ABNORMAL HIGH (ref 11.4–15.2)

## 2021-01-01 LAB — ABO/RH: ABO/RH(D): A POS

## 2021-01-01 LAB — MAGNESIUM: Magnesium: 2.6 mg/dL — ABNORMAL HIGH (ref 1.7–2.4)

## 2021-01-01 LAB — APTT: aPTT: 35 seconds (ref 24–36)

## 2021-01-01 LAB — PHOSPHORUS: Phosphorus: 3.1 mg/dL (ref 2.5–4.6)

## 2021-01-01 SURGERY — CORONARY ARTERY BYPASS GRAFTING (CABG)
Anesthesia: General | Site: Chest

## 2021-01-01 MED ORDER — MIDAZOLAM HCL 5 MG/5ML IJ SOLN
INTRAMUSCULAR | Status: DC | PRN
  Administered 2021-01-01: 2 mg via INTRAVENOUS
  Administered 2021-01-01: 1 mg via INTRAVENOUS
  Administered 2021-01-01 (×2): 2 mg via INTRAVENOUS
  Administered 2021-01-01: 1 mg via INTRAVENOUS

## 2021-01-01 MED ORDER — ROSUVASTATIN CALCIUM 20 MG PO TABS
40.0000 mg | ORAL_TABLET | Freq: Every day | ORAL | Status: DC
  Administered 2021-01-01 – 2021-01-05 (×5): 40 mg via ORAL
  Filled 2021-01-01 (×5): qty 2

## 2021-01-01 MED ORDER — PROPOFOL 10 MG/ML IV BOLUS
INTRAVENOUS | Status: DC | PRN
  Administered 2021-01-01 (×2): 20 mg via INTRAVENOUS
  Administered 2021-01-01: 90 mg via INTRAVENOUS

## 2021-01-01 MED ORDER — ACETAMINOPHEN 500 MG PO TABS
1000.0000 mg | ORAL_TABLET | Freq: Four times a day (QID) | ORAL | Status: DC
  Administered 2021-01-02 – 2021-01-03 (×6): 1000 mg via ORAL
  Filled 2021-01-01 (×6): qty 2

## 2021-01-01 MED ORDER — PROPOFOL 10 MG/ML IV BOLUS
INTRAVENOUS | Status: AC
  Filled 2021-01-01: qty 20

## 2021-01-01 MED ORDER — METOPROLOL TARTRATE 12.5 MG HALF TABLET
12.5000 mg | ORAL_TABLET | Freq: Once | ORAL | Status: AC
  Administered 2021-01-01: 12.5 mg via ORAL
  Filled 2021-01-01: qty 1

## 2021-01-01 MED ORDER — ACETAMINOPHEN 650 MG RE SUPP: 650.0000 mg | Freq: Once | RECTAL | Status: AC

## 2021-01-01 MED ORDER — PHENYLEPHRINE 40 MCG/ML (10ML) SYRINGE FOR IV PUSH (FOR BLOOD PRESSURE SUPPORT)
PREFILLED_SYRINGE | INTRAVENOUS | Status: DC | PRN
  Administered 2021-01-01: 80 ug via INTRAVENOUS
  Administered 2021-01-01 (×2): 40 ug via INTRAVENOUS
  Administered 2021-01-01: 20 ug via INTRAVENOUS
  Administered 2021-01-01: 80 ug via INTRAVENOUS
  Administered 2021-01-01: 20 ug via INTRAVENOUS
  Administered 2021-01-01: 80 ug via INTRAVENOUS

## 2021-01-01 MED ORDER — LACTATED RINGERS IV SOLN: INTRAVENOUS | Status: DC | PRN

## 2021-01-01 MED ORDER — THROMBIN 20000 UNITS EX SOLR
OROMUCOSAL | Status: DC | PRN
  Administered 2021-01-01 (×3): 4 mL via TOPICAL

## 2021-01-01 MED ORDER — DEXTROSE 50 % IV SOLN: 25.0000 g | INTRAVENOUS | Status: AC

## 2021-01-01 MED ORDER — CHLORHEXIDINE GLUCONATE CLOTH 2 % EX PADS
6.0000 | MEDICATED_PAD | Freq: Every day | CUTANEOUS | Status: DC
  Administered 2021-01-01 – 2021-01-03 (×3): 6 via TOPICAL

## 2021-01-01 MED ORDER — CHLORHEXIDINE GLUCONATE 0.12 % MT SOLN
15.0000 mL | OROMUCOSAL | Status: AC
  Administered 2021-01-01: 15 mL via OROMUCOSAL
  Filled 2021-01-01: qty 15

## 2021-01-01 MED ORDER — HEPARIN SODIUM (PORCINE) 1000 UNIT/ML IJ SOLN
INTRAMUSCULAR | Status: AC
  Filled 2021-01-01: qty 1

## 2021-01-01 MED ORDER — PLASMA-LYTE 148 IV SOLN
INTRAVENOUS | Status: DC | PRN
  Administered 2021-01-01: 500 mL

## 2021-01-01 MED ORDER — MAGNESIUM SULFATE 4 GM/100ML IV SOLN
4.0000 g | Freq: Once | INTRAVENOUS | Status: AC
  Administered 2021-01-01: 4 g via INTRAVENOUS
  Filled 2021-01-01: qty 100

## 2021-01-01 MED ORDER — MIDAZOLAM HCL 2 MG/2ML IJ SOLN
2.0000 mg | INTRAMUSCULAR | Status: DC | PRN
Start: 2021-01-01 — End: 2021-01-01
  Administered 2021-01-01: 2 mg via INTRAVENOUS
  Filled 2021-01-01: qty 2

## 2021-01-01 MED ORDER — LIDOCAINE 2% (20 MG/ML) 5 ML SYRINGE
INTRAMUSCULAR | Status: DC | PRN
  Administered 2021-01-01: 80 mg via INTRAVENOUS

## 2021-01-01 MED ORDER — ASPIRIN 81 MG PO CHEW: 324.0000 mg | CHEWABLE_TABLET | Freq: Every day | ORAL | Status: DC

## 2021-01-01 MED ORDER — INSULIN ASPART 100 UNIT/ML ~~LOC~~ SOLN
0.0000 [IU] | SUBCUTANEOUS | Status: DC
  Administered 2021-01-01 – 2021-01-02 (×2): 2 [IU] via SUBCUTANEOUS
  Administered 2021-01-02 (×2): 4 [IU] via SUBCUTANEOUS
  Administered 2021-01-02: 2 [IU] via SUBCUTANEOUS
  Administered 2021-01-02: 4 [IU] via SUBCUTANEOUS
  Administered 2021-01-02 – 2021-01-03 (×3): 2 [IU] via SUBCUTANEOUS
  Administered 2021-01-03: 4 [IU] via SUBCUTANEOUS
  Administered 2021-01-03: 2 [IU] via SUBCUTANEOUS

## 2021-01-01 MED ORDER — FAMOTIDINE IN NACL 20-0.9 MG/50ML-% IV SOLN
20.0000 mg | Freq: Two times a day (BID) | INTRAVENOUS | Status: DC
  Administered 2021-01-01: 20 mg via INTRAVENOUS
  Filled 2021-01-01: qty 50

## 2021-01-01 MED ORDER — ROCURONIUM BROMIDE 10 MG/ML (PF) SYRINGE
PREFILLED_SYRINGE | INTRAVENOUS | Status: AC
  Filled 2021-01-01: qty 10

## 2021-01-01 MED ORDER — SODIUM CHLORIDE 0.9% FLUSH
3.0000 mL | Freq: Two times a day (BID) | INTRAVENOUS | Status: DC
  Administered 2021-01-02 – 2021-01-03 (×3): 3 mL via INTRAVENOUS

## 2021-01-01 MED ORDER — DEXTROSE 50 % IV SOLN
0.0000 mL | INTRAVENOUS | Status: DC | PRN
  Filled 2021-01-01: qty 50

## 2021-01-01 MED ORDER — CEFUROXIME SODIUM 1.5 G IV SOLR
1.5000 g | Freq: Two times a day (BID) | INTRAVENOUS | Status: AC
  Administered 2021-01-01 – 2021-01-03 (×4): 1.5 g via INTRAVENOUS
  Filled 2021-01-01 (×5): qty 1.5

## 2021-01-01 MED ORDER — METOPROLOL TARTRATE 5 MG/5ML IV SOLN: 2.5000 mg | INTRAVENOUS | Status: DC | PRN

## 2021-01-01 MED ORDER — FENTANYL CITRATE (PF) 250 MCG/5ML IJ SOLN
INTRAMUSCULAR | Status: AC
  Filled 2021-01-01: qty 25

## 2021-01-01 MED ORDER — NITROGLYCERIN IN D5W 200-5 MCG/ML-% IV SOLN: 0.0000 ug/min | INTRAVENOUS | Status: DC

## 2021-01-01 MED ORDER — INSULIN REGULAR(HUMAN) IN NACL 100-0.9 UT/100ML-% IV SOLN: INTRAVENOUS | Status: DC

## 2021-01-01 MED ORDER — METOPROLOL TARTRATE 25 MG/10 ML ORAL SUSPENSION: 12.5000 mg | Freq: Two times a day (BID) | ORAL | Status: DC

## 2021-01-01 MED ORDER — PHENYLEPHRINE HCL (PRESSORS) 10 MG/ML IV SOLN: INTRAVENOUS | Status: DC | PRN

## 2021-01-01 MED ORDER — ALBUMIN HUMAN 5 % IV SOLN
250.0000 mL | INTRAVENOUS | Status: AC | PRN
  Administered 2021-01-01 – 2021-01-02 (×3): 12.5 g via INTRAVENOUS
  Filled 2021-01-01 (×2): qty 250

## 2021-01-01 MED ORDER — BISACODYL 10 MG RE SUPP: 10.0000 mg | Freq: Every day | RECTAL | Status: DC

## 2021-01-01 MED ORDER — CHLORHEXIDINE GLUCONATE 4 % EX LIQD: 30.0000 mL | CUTANEOUS | Status: DC

## 2021-01-01 MED ORDER — METOPROLOL TARTRATE 12.5 MG HALF TABLET: 12.5000 mg | ORAL_TABLET | Freq: Two times a day (BID) | ORAL | Status: DC

## 2021-01-01 MED ORDER — SODIUM CHLORIDE 0.9 % IV SOLN: INTRAVENOUS | Status: DC

## 2021-01-01 MED ORDER — MORPHINE SULFATE (PF) 2 MG/ML IV SOLN
1.0000 mg | INTRAVENOUS | Status: DC | PRN
  Administered 2021-01-01: 2 mg via INTRAVENOUS
  Filled 2021-01-01: qty 1

## 2021-01-01 MED ORDER — NITROGLYCERIN 0.2 MG/ML ON CALL CATH LAB
INTRAVENOUS | Status: DC | PRN
  Administered 2021-01-01: 40 ug via INTRAVENOUS

## 2021-01-01 MED ORDER — LACTATED RINGERS IV SOLN: INTRAVENOUS | Status: DC

## 2021-01-01 MED ORDER — TRAMADOL HCL 50 MG PO TABS
50.0000 mg | ORAL_TABLET | Freq: Four times a day (QID) | ORAL | Status: DC | PRN
  Administered 2021-01-02 – 2021-01-03 (×2): 50 mg via ORAL
  Filled 2021-01-01 (×2): qty 1

## 2021-01-01 MED ORDER — DEXTROSE 50 % IV SOLN
12.5000 g | INTRAVENOUS | Status: AC
  Administered 2021-01-01: 12.5 g via INTRAVENOUS

## 2021-01-01 MED ORDER — CHLORHEXIDINE GLUCONATE CLOTH 2 % EX PADS: 6.0000 | MEDICATED_PAD | Freq: Every day | CUTANEOUS | Status: DC

## 2021-01-01 MED ORDER — ORAL CARE MOUTH RINSE: 15.0000 mL | Freq: Two times a day (BID) | OROMUCOSAL | Status: DC

## 2021-01-01 MED ORDER — BISACODYL 5 MG PO TBEC
10.0000 mg | DELAYED_RELEASE_TABLET | Freq: Every day | ORAL | Status: DC
  Administered 2021-01-02 – 2021-01-03 (×2): 10 mg via ORAL
  Filled 2021-01-01 (×2): qty 2

## 2021-01-01 MED ORDER — MIDAZOLAM HCL (PF) 10 MG/2ML IJ SOLN
INTRAMUSCULAR | Status: AC
  Filled 2021-01-01: qty 2

## 2021-01-01 MED ORDER — ASPIRIN EC 325 MG PO TBEC
325.0000 mg | DELAYED_RELEASE_TABLET | Freq: Every day | ORAL | Status: DC
  Administered 2021-01-02 – 2021-01-03 (×2): 325 mg via ORAL
  Filled 2021-01-01 (×2): qty 1

## 2021-01-01 MED ORDER — PANTOPRAZOLE SODIUM 40 MG PO TBEC
40.0000 mg | DELAYED_RELEASE_TABLET | Freq: Every day | ORAL | Status: DC
  Administered 2021-01-03: 40 mg via ORAL
  Filled 2021-01-01: qty 1

## 2021-01-01 MED ORDER — THROMBIN (RECOMBINANT) 20000 UNITS EX SOLR
CUTANEOUS | Status: AC
  Filled 2021-01-01: qty 20000

## 2021-01-01 MED ORDER — HEMOSTATIC AGENTS (NO CHARGE) OPTIME
TOPICAL | Status: DC | PRN
  Administered 2021-01-01: 1 via TOPICAL

## 2021-01-01 MED ORDER — ALBUMIN HUMAN 5 % IV SOLN: INTRAVENOUS | Status: DC | PRN

## 2021-01-01 MED ORDER — INSULIN ASPART PROT & ASPART (70-30 MIX) 100 UNIT/ML ~~LOC~~ SUSP: 13.0000 [IU] | Freq: Three times a day (TID) | SUBCUTANEOUS | Status: DC

## 2021-01-01 MED ORDER — PROTAMINE SULFATE 10 MG/ML IV SOLN
INTRAVENOUS | Status: DC | PRN
  Administered 2021-01-01: 310 mg via INTRAVENOUS

## 2021-01-01 MED ORDER — SODIUM CHLORIDE 0.45 % IV SOLN: INTRAVENOUS | Status: DC | PRN

## 2021-01-01 MED ORDER — 0.9 % SODIUM CHLORIDE (POUR BTL) OPTIME
TOPICAL | Status: DC | PRN
Start: 2021-01-01 — End: 2021-01-01
  Administered 2021-01-01: 5000 mL

## 2021-01-01 MED ORDER — SODIUM CHLORIDE 0.9 % IV SOLN: 250.0000 mL | INTRAVENOUS | Status: DC

## 2021-01-01 MED ORDER — VANCOMYCIN HCL IN DEXTROSE 1-5 GM/200ML-% IV SOLN
1000.0000 mg | Freq: Once | INTRAVENOUS | Status: AC
  Administered 2021-01-01: 1000 mg via INTRAVENOUS
  Filled 2021-01-01: qty 200

## 2021-01-01 MED ORDER — PROTAMINE SULFATE 10 MG/ML IV SOLN
INTRAVENOUS | Status: AC
  Filled 2021-01-01: qty 5

## 2021-01-01 MED ORDER — ROCURONIUM BROMIDE 10 MG/ML (PF) SYRINGE
PREFILLED_SYRINGE | INTRAVENOUS | Status: DC | PRN
  Administered 2021-01-01 (×2): 50 mg via INTRAVENOUS
  Administered 2021-01-01: 100 mg via INTRAVENOUS
  Administered 2021-01-01: 50 mg via INTRAVENOUS
  Administered 2021-01-01: 30 mg via INTRAVENOUS

## 2021-01-01 MED ORDER — DOCUSATE SODIUM 100 MG PO CAPS
200.0000 mg | ORAL_CAPSULE | Freq: Every day | ORAL | Status: DC
  Administered 2021-01-02 – 2021-01-03 (×2): 200 mg via ORAL
  Filled 2021-01-01 (×2): qty 2

## 2021-01-01 MED ORDER — PHENYLEPHRINE HCL-NACL 20-0.9 MG/250ML-% IV SOLN
0.0000 ug/min | INTRAVENOUS | Status: DC
  Administered 2021-01-01: 25 ug/min via INTRAVENOUS
  Administered 2021-01-02: 45 ug/min via INTRAVENOUS
  Administered 2021-01-02: 5 ug/min via INTRAVENOUS
  Administered 2021-01-02: 15 ug/min via INTRAVENOUS
  Administered 2021-01-02: 25 ug/min via INTRAVENOUS
  Administered 2021-01-02: 35 ug/min via INTRAVENOUS
  Filled 2021-01-01 (×3): qty 250

## 2021-01-01 MED ORDER — ONDANSETRON HCL 4 MG/2ML IJ SOLN
4.0000 mg | Freq: Four times a day (QID) | INTRAMUSCULAR | Status: DC | PRN
  Administered 2021-01-01 – 2021-01-03 (×4): 4 mg via INTRAVENOUS
  Filled 2021-01-01 (×5): qty 2

## 2021-01-01 MED ORDER — LIDOCAINE 2% (20 MG/ML) 5 ML SYRINGE
INTRAMUSCULAR | Status: AC
  Filled 2021-01-01: qty 5

## 2021-01-01 MED ORDER — DEXMEDETOMIDINE HCL IN NACL 400 MCG/100ML IV SOLN
0.0000 ug/kg/h | INTRAVENOUS | Status: DC
  Administered 2021-01-01: 0.7 ug/kg/h via INTRAVENOUS
  Filled 2021-01-01: qty 100

## 2021-01-01 MED ORDER — ACETAMINOPHEN 160 MG/5ML PO SOLN
650.0000 mg | Freq: Once | ORAL | Status: AC
  Administered 2021-01-01: 650 mg

## 2021-01-01 MED ORDER — CHLORHEXIDINE GLUCONATE 0.12 % MT SOLN
15.0000 mL | Freq: Once | OROMUCOSAL | Status: DC
  Filled 2021-01-01: qty 15

## 2021-01-01 MED ORDER — ACETAMINOPHEN 160 MG/5ML PO SOLN: 1000.0000 mg | Freq: Four times a day (QID) | ORAL | Status: DC

## 2021-01-01 MED ORDER — PHENYLEPHRINE 40 MCG/ML (10ML) SYRINGE FOR IV PUSH (FOR BLOOD PRESSURE SUPPORT)
PREFILLED_SYRINGE | INTRAVENOUS | Status: AC
  Filled 2021-01-01: qty 10

## 2021-01-01 MED ORDER — SODIUM CHLORIDE 0.9% FLUSH: 3.0000 mL | INTRAVENOUS | Status: DC | PRN

## 2021-01-01 MED ORDER — LACTATED RINGERS IV SOLN: 500.0000 mL | Freq: Once | INTRAVENOUS | Status: DC | PRN

## 2021-01-01 MED ORDER — OXYCODONE HCL 5 MG PO TABS
5.0000 mg | ORAL_TABLET | ORAL | Status: DC | PRN
Start: 2021-01-01 — End: 2021-01-03
  Administered 2021-01-01 – 2021-01-03 (×4): 5 mg via ORAL
  Filled 2021-01-01 (×4): qty 1

## 2021-01-01 MED ORDER — DEXTROSE 50 % IV SOLN
INTRAVENOUS | Status: AC
  Administered 2021-01-01: 50 mL
  Filled 2021-01-01: qty 50

## 2021-01-01 MED ORDER — FENTANYL CITRATE (PF) 250 MCG/5ML IJ SOLN
INTRAMUSCULAR | Status: DC | PRN
  Administered 2021-01-01: 100 ug via INTRAVENOUS
  Administered 2021-01-01: 50 ug via INTRAVENOUS
  Administered 2021-01-01: 100 ug via INTRAVENOUS
  Administered 2021-01-01: 200 ug via INTRAVENOUS
  Administered 2021-01-01 (×5): 100 ug via INTRAVENOUS
  Administered 2021-01-01: 50 ug via INTRAVENOUS
  Administered 2021-01-01: 100 ug via INTRAVENOUS
  Administered 2021-01-01: 150 ug via INTRAVENOUS

## 2021-01-01 MED ORDER — POTASSIUM CHLORIDE 10 MEQ/50ML IV SOLN
10.0000 meq | INTRAVENOUS | Status: AC
Start: 2021-01-01 — End: 2021-01-01
  Administered 2021-01-01 (×3): 10 meq via INTRAVENOUS

## 2021-01-01 MED ORDER — SODIUM BICARBONATE 8.4 % IV SOLN
50.0000 meq | Freq: Once | INTRAVENOUS | Status: AC
  Administered 2021-01-01: 50 meq via INTRAVENOUS

## 2021-01-01 MED ORDER — HEPARIN SODIUM (PORCINE) 1000 UNIT/ML IJ SOLN
INTRAMUSCULAR | Status: DC | PRN
  Administered 2021-01-01: 31000 [IU] via INTRAVENOUS

## 2021-01-01 SURGICAL SUPPLY — 112 items
BAG DECANTER FOR FLEXI CONT (MISCELLANEOUS) ×4 IMPLANT
BLADE CLIPPER SURG (BLADE) IMPLANT
BLADE STERNUM SYSTEM 6 (BLADE) ×4 IMPLANT
BLADE SURG 11 STRL SS (BLADE) ×4 IMPLANT
BNDG ELASTIC 4X5.8 VLCR STR LF (GAUZE/BANDAGES/DRESSINGS) ×4 IMPLANT
BNDG ELASTIC 6X10 VLCR STRL LF (GAUZE/BANDAGES/DRESSINGS) ×4 IMPLANT
BNDG ELASTIC 6X5.8 VLCR STR LF (GAUZE/BANDAGES/DRESSINGS) ×4 IMPLANT
BNDG GAUZE ELAST 4 BULKY (GAUZE/BANDAGES/DRESSINGS) ×4 IMPLANT
CANISTER SUCT 3000ML PPV (MISCELLANEOUS) ×4 IMPLANT
CATH ROBINSON RED A/P 18FR (CATHETERS) ×8 IMPLANT
CATH THORACIC 28FR (CATHETERS) ×8 IMPLANT
CATH THORACIC 36FR (CATHETERS) ×4 IMPLANT
CATH THORACIC 36FR RT ANG (CATHETERS) ×4 IMPLANT
CLIP VESOCCLUDE MED 24/CT (CLIP) IMPLANT
CLIP VESOCCLUDE SM WIDE 24/CT (CLIP) ×4 IMPLANT
CONN Y 3/8X3/8X3/8  BEN (MISCELLANEOUS) ×2
CONN Y 3/8X3/8X3/8 BEN (MISCELLANEOUS) ×2 IMPLANT
DERMABOND ADHESIVE PROPEN (GAUZE/BANDAGES/DRESSINGS) ×2
DERMABOND ADVANCED (GAUZE/BANDAGES/DRESSINGS) ×2
DERMABOND ADVANCED .7 DNX12 (GAUZE/BANDAGES/DRESSINGS) ×2 IMPLANT
DERMABOND ADVANCED .7 DNX6 (GAUZE/BANDAGES/DRESSINGS) ×2 IMPLANT
DRAPE CARDIOVASCULAR INCISE (DRAPES) ×2
DRAPE SLUSH/WARMER DISC (DRAPES) ×4 IMPLANT
DRAPE SRG 135X102X78XABS (DRAPES) ×2 IMPLANT
DRSG AQUACEL AG ADV 3.5X14 (GAUZE/BANDAGES/DRESSINGS) ×4 IMPLANT
DRSG COVADERM 4X14 (GAUZE/BANDAGES/DRESSINGS) ×4 IMPLANT
ELECT BLADE 4.0 EZ CLEAN MEGAD (MISCELLANEOUS) ×4
ELECT CAUTERY BLADE 6.4 (BLADE) ×4 IMPLANT
ELECT REM PT RETURN 9FT ADLT (ELECTROSURGICAL) ×8
ELECTRODE BLDE 4.0 EZ CLN MEGD (MISCELLANEOUS) ×2 IMPLANT
ELECTRODE REM PT RTRN 9FT ADLT (ELECTROSURGICAL) ×4 IMPLANT
FELT TEFLON 1X6 (MISCELLANEOUS) ×8 IMPLANT
GAUZE SPONGE 4X4 12PLY STRL (GAUZE/BANDAGES/DRESSINGS) ×8 IMPLANT
GLOVE BIO SURGEON STRL SZ 6 (GLOVE) IMPLANT
GLOVE BIO SURGEON STRL SZ 6.5 (GLOVE) IMPLANT
GLOVE BIO SURGEON STRL SZ7 (GLOVE) IMPLANT
GLOVE BIO SURGEON STRL SZ7.5 (GLOVE) IMPLANT
GLOVE BIO SURGEONS STRL SZ 6.5 (GLOVE)
GLOVE BIOGEL PI IND STRL 6 (GLOVE) IMPLANT
GLOVE BIOGEL PI INDICATOR 6 (GLOVE)
GLOVE ORTHO TXT STRL SZ7.5 (GLOVE) IMPLANT
GLOVE SS BIOGEL STRL SZ 6.5 (GLOVE) ×8 IMPLANT
GLOVE SUPERSENSE BIOGEL SZ 6.5 (GLOVE) ×8
GLOVE SURG UNDER POLY LF SZ6.5 (GLOVE) ×16 IMPLANT
GLOVE SURG UNDER POLY LF SZ7 (GLOVE) IMPLANT
GLOVE TRIUMPH SURG SIZE 7.0 (KITS) ×8 IMPLANT
GOWN STRL REUS W/ TWL LRG LVL3 (GOWN DISPOSABLE) ×14 IMPLANT
GOWN STRL REUS W/ TWL XL LVL3 (GOWN DISPOSABLE) ×2 IMPLANT
GOWN STRL REUS W/TWL LRG LVL3 (GOWN DISPOSABLE) ×14
GOWN STRL REUS W/TWL XL LVL3 (GOWN DISPOSABLE) ×2
HEMOSTAT POWDER SURGIFOAM 1G (HEMOSTASIS) ×12 IMPLANT
HEMOSTAT SURGICEL 2X14 (HEMOSTASIS) ×4 IMPLANT
INSERT FOGARTY 61MM (MISCELLANEOUS) IMPLANT
INSERT FOGARTY XLG (MISCELLANEOUS) IMPLANT
KIT BASIN OR (CUSTOM PROCEDURE TRAY) ×4 IMPLANT
KIT CATH CPB BARTLE (MISCELLANEOUS) ×4 IMPLANT
KIT SUCTION CATH 14FR (SUCTIONS) ×4 IMPLANT
KIT TURNOVER KIT B (KITS) ×4 IMPLANT
KIT VASOVIEW HEMOPRO 2 VH 4000 (KITS) ×4 IMPLANT
LINE VENT (MISCELLANEOUS) ×4 IMPLANT
NS IRRIG 1000ML POUR BTL (IV SOLUTION) ×24 IMPLANT
PACK E OPEN HEART (SUTURE) ×4 IMPLANT
PACK OPEN HEART (CUSTOM PROCEDURE TRAY) ×4 IMPLANT
PAD ARMBOARD 7.5X6 YLW CONV (MISCELLANEOUS) ×8 IMPLANT
PAD ELECT DEFIB RADIOL ZOLL (MISCELLANEOUS) ×4 IMPLANT
PENCIL BUTTON HOLSTER BLD 10FT (ELECTRODE) ×4 IMPLANT
POSITIONER HEAD DONUT 9IN (MISCELLANEOUS) ×4 IMPLANT
PUMP SARN DELFIN (MISCELLANEOUS) ×4 IMPLANT
PUNCH AORTIC ROTATE 4.0MM (MISCELLANEOUS) IMPLANT
PUNCH AORTIC ROTATE 4.5MM 8IN (MISCELLANEOUS) ×4 IMPLANT
PUNCH AORTIC ROTATE 5MM 8IN (MISCELLANEOUS) IMPLANT
SET CARDIOPLEGIA MPS 5001102 (MISCELLANEOUS) ×4 IMPLANT
SPONGE INTESTINAL PEANUT (DISPOSABLE) IMPLANT
SPONGE LAP 18X18 RF (DISPOSABLE) ×4 IMPLANT
SPONGE LAP 18X18 X RAY DECT (DISPOSABLE) ×4 IMPLANT
SPONGE LAP 4X18 RFD (DISPOSABLE) ×4 IMPLANT
SUPPORT HEART JANKE-BARRON (MISCELLANEOUS) ×4 IMPLANT
SUT BONE WAX W31G (SUTURE) ×4 IMPLANT
SUT MNCRL AB 4-0 PS2 18 (SUTURE) ×4 IMPLANT
SUT PROLENE 3 0 SH DA (SUTURE) IMPLANT
SUT PROLENE 3 0 SH1 36 (SUTURE) ×4 IMPLANT
SUT PROLENE 4 0 RB 1 (SUTURE)
SUT PROLENE 4 0 SH DA (SUTURE) IMPLANT
SUT PROLENE 4-0 RB1 .5 CRCL 36 (SUTURE) IMPLANT
SUT PROLENE 5 0 C 1 36 (SUTURE) IMPLANT
SUT PROLENE 6 0 C 1 30 (SUTURE) ×12 IMPLANT
SUT PROLENE 7 0 BV 1 (SUTURE) ×4 IMPLANT
SUT PROLENE 7 0 BV1 MDA (SUTURE) ×8 IMPLANT
SUT PROLENE 8 0 BV175 6 (SUTURE) IMPLANT
SUT SILK  1 MH (SUTURE) ×2
SUT SILK 1 MH (SUTURE) ×2 IMPLANT
SUT SILK 2 0 SH (SUTURE) IMPLANT
SUT STEEL STERNAL CCS#1 18IN (SUTURE) IMPLANT
SUT STEEL SZ 6 DBL 3X14 BALL (SUTURE) ×12 IMPLANT
SUT VIC AB 1 CTX 36 (SUTURE) ×4
SUT VIC AB 1 CTX36XBRD ANBCTR (SUTURE) ×4 IMPLANT
SUT VIC AB 2-0 CT1 27 (SUTURE) ×2
SUT VIC AB 2-0 CT1 TAPERPNT 27 (SUTURE) ×2 IMPLANT
SUT VIC AB 2-0 CTX 27 (SUTURE) IMPLANT
SUT VIC AB 3-0 SH 27 (SUTURE)
SUT VIC AB 3-0 SH 27X BRD (SUTURE) IMPLANT
SUT VIC AB 3-0 X1 27 (SUTURE) IMPLANT
SUT VICRYL 4-0 PS2 18IN ABS (SUTURE) IMPLANT
SYSTEM SAHARA CHEST DRAIN ATS (WOUND CARE) ×4 IMPLANT
TAPE CLOTH SURG 4X10 WHT LF (GAUZE/BANDAGES/DRESSINGS) ×4 IMPLANT
TAPE PAPER 2X10 WHT MICROPORE (GAUZE/BANDAGES/DRESSINGS) ×4 IMPLANT
TOWEL GREEN STERILE (TOWEL DISPOSABLE) ×4 IMPLANT
TOWEL GREEN STERILE FF (TOWEL DISPOSABLE) ×4 IMPLANT
TRAY FOLEY SLVR 16FR TEMP STAT (SET/KITS/TRAYS/PACK) ×4 IMPLANT
TUBING LAP HI FLOW INSUFFLATIO (TUBING) ×4 IMPLANT
UNDERPAD 30X36 HEAVY ABSORB (UNDERPADS AND DIAPERS) ×4 IMPLANT
WATER STERILE IRR 1000ML POUR (IV SOLUTION) ×8 IMPLANT

## 2021-01-01 NOTE — Discharge Summary (Signed)
Physician Discharge Summary       [address] [place]       [place] [address]             [phone]    Patient ID: Mitchell Peterson MRN: [id number] DOB/AGE: August 03, 1946 75 y.o.  Admit date: 01/01/2021 Discharge date: 01/06/2021  Admission Diagnoses: Coronary artery disease Discharge Diagnoses:  1. S/p CABG x 5 2. Brief post op a fib 3. Expected post op blood loss anemia 4. History of Diabetes mellitus without complication (HCC) 5. History of Hypertension 6. History of Myocardial infarction (HCC)-had stents placed 7. History of Vitamin D deficiency 8. History of DJD 9. History of tobacco abuse  Consults: None  Procedure (s):  1. Median Sternotomy 2. Extracorporeal circulation 3.   Coronary artery bypass grafting x 5   Left internal mammary artery graft to the LAD  SVG to diagonal  Sequential SVG to OM1 and OM2  SVG to PDA 4.   Endoscopic vein harvest from the right leg by Dr. Laneta Simmers on 01/01/2021.  History of Presenting Illness: The patient is a 75 year old gentleman with history of hypertension, type 2 diabetes, and coronary artery disease status post myocardial infarction and remote stenting in 2000 and 2002 although the details of those procedures are unknown. He now presents with a 76-month history of exertional substernal chest pain that he described as sharp and stabbing and associated with exertion. This has been progressing over the past several months and occurring on a daily basis. He was evaluated at [facility] in January with chest pain and ruled out for myocardial infarction. He was discharged from the emergency room and subsequently had a Cardiolite stress test showing inferior, lateral, and apical ischemia with possible lateral infarct. He subsequently underwent cardiac catheterization by Dr. Hyacinth Meeker on 12/15/2020. This showed severe three-vessel coronary disease. The LAD had mild proximal disease and then was diffusely diseased after the  first diagonal branch. The left circumflex had proximal 99% stenosis at the first marginal branch. Second marginal was large and had 75 to 80% stenosis. The right coronary artery was a dominant vessel with 25 to 30% proximal stenosis and distal 75% stenosis. Left ventriculogram showed low normal left ventricular ejection fraction.  His wife is not in the best health and he takes care of her at home. 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 He receives most of his care at the [facility] in [place] or [place] and sees Dr. Hyacinth Meeker for his cardiology care.  Dr. Laneta Simmers discussed the need for coronary artery bypass grafting surgery. Potential risks, benefits, and complications of the surgery were discussed with the patient and he agreed to proceed with surgery. Pre operative carotid duplex US showed no significant internal carotid artery stenosis bilaterally. He underwent a CABG x 5.  Brief Hospital Course:   He tolerated the procedure without difficulty and was taken to the SICU in stable condition. He was extubated uneventfully the evening of surgery.  He required Neo-synephrine for BP support, this was weaned as hemodynamics allowed.  Theone Murdoch, a line, foley, and chest tubes were removed early in his post operative course. He was put on Midodrine to assist with low BP. As BP improved, he was diuresed for volume overload. He was then started on low dose Lopressor 03/23. He went into a fib on 03/23 and was put on an Amiodarone drip. He converted to  sinus rhythm and was transitioned to oral Amiodarone on 03/24. He then remained in sinus rhythm. He was weaned off the Insulin drip. Metformin will be restarted at discharge. His pre op HGA1C was 8.5. He will need close medical follow up after discharge. He was ambulating on room air with good oxygenation. He was felt surgically stable for transfer from the ICU to 4E but a bed was  not available until 03/24. Epicardial pacing wires were removed on 03/25.  His blood pressure stabilized and Midodrine was discontinued prior to discharge. Chest tube sutures will be removed in the office after discharge. His sternal and RLE wounds are clean, dry, and healing without signs of infection. He has ben tolerating a diet and has had a bowel movement. He is felt surgically stable for discharge today.   Latest Vital Signs: Blood pressure 122/67, pulse 75, temperature 98.5 F (36.9 C), temperature source Oral, resp. rate 17, height 5\' 8"  (1.727 m), weight 85.6 kg, SpO2 93 %.  Physical Exam:  General appearance: alert, cooperative and no distress Heart: regular rate and rhythm Lungs: clear to auscultation bilaterally Abdomen: soft, non-tender; bowel sounds normal; no masses,  no organomegaly Extremities: edema trace Wound: clean and dry  Discharge Condition: Stable and discharged to home.  Recent laboratory studies:  Lab Results  Component Value Date   WBC 10.7 (H) 01/05/2021   HGB 9.6 (L) 01/05/2021   HCT 28.8 (L) 01/05/2021   MCV 84.5 01/05/2021   PLT 258 01/05/2021   Lab Results  Component Value Date   NA 135 01/05/2021   K 4.1 01/05/2021   CL 99 01/05/2021   CO2 29 01/05/2021   CREATININE 0.70 01/05/2021   GLUCOSE 90 01/05/2021      Diagnostic Studies: DG Chest 2 View  Result Date: 01/05/2021 CLINICAL DATA:  Pleural effusion EXAM: CHEST - 2 VIEW COMPARISON:  January 03, 2021 FINDINGS: There is a persistent small left pleural effusion with ill-defined airspace opacity in the left base. There is mild right base atelectasis. There is cardiomegaly with pulmonary vascularity within normal limits. Patient is status post coronary artery bypass grafting. No adenopathy. No pneumothorax. No bone lesions. IMPRESSION: Persistent small left pleural effusion with airspace opacity concerning for combination of atelectasis and pneumonia in the posterior left base. Mild right base  atelectasis. Stable cardiomegaly. Postoperative changes. Electronically Signed   By: January 05, 2021 III M.D.   On: 01/05/2021 07:57   DG Chest 2 View  Result Date: 01/01/2021 CLINICAL DATA:  75 year old male under preoperative evaluation. EXAM: CHEST - 2 VIEW COMPARISON:  No priors. FINDINGS: Lung volumes are normal. No consolidative airspace disease. No pleural effusions. No pneumothorax. No pulmonary nodule or mass noted. Pulmonary vasculature and the cardiomediastinal silhouette are within normal limits. Atherosclerosis in the thoracic aorta. IMPRESSION: 1.  No radiographic evidence of acute cardiopulmonary disease. 2. Aortic atherosclerosis. Electronically Signed   By: 67 M.D.   On: 01/01/2021 11:42   DG Chest Port 1 View  Result Date: 01/03/2021 CLINICAL DATA:  Sore chest.  CABG. EXAM: PORTABLE CHEST 1 VIEW COMPARISON:  01/02/2021. FINDINGS: Interim removal of Swan-Ganz catheter, mediastinal drainage catheter, and bilateral chest tubes. Prior CABG. Stable cardiomegaly. Previously identified small pneumopericardium no longer identified. Low lung volumes with persistent bibasilar atelectasis. No prominent pleural effusion. No pneumothorax. IMPRESSION: 1. Interim removal of Swan-Ganz catheter, mediastinal drainage catheter, bilateral chest tubes. No pneumothorax. 2. Prior CABG. Stable cardiomegaly. Previously identified small pneumopericardium no longer identified. 3.  Low lung volumes  with persistent bibasilar atelectasis. Electronically Signed   By: Maisie Fus  Register   On: 01/03/2021 06:31   DG Chest Port 1 View  Result Date: 01/02/2021 CLINICAL DATA:  Chest tube.  Open-heart surgery. EXAM: PORTABLE CHEST 1 VIEW COMPARISON:  01/01/2021. FINDINGS: Interim extubation removal of NG tube. Swan-Ganz catheter, mediastinal drainage catheters, and bilateral chest tubes in stable position. Prior CABG. Stable cardiomegaly. No pulmonary venous congestion. Small pneumopericardium again most  likely present. No interim change. Low lung volumes with persistent bibasilar atelectasis. IMPRESSION: 1. Interim extubation and removal of NG tube. Swan-Ganz catheter, mediastinal drainage catheters, bilateral chest tubes in stable position. No pneumothorax. 2. Prior CABG. Stable cardiomegaly. No pulmonary venous congestion. Small pneumopericardium again most likely present, no interim change. Electronically Signed   By: Maisie Fus  Register   On: 01/02/2021 05:59   DG Chest Port 1 View  Result Date: 01/01/2021 CLINICAL DATA:  Status post CABG EXAM: PORTABLE CHEST 1 VIEW COMPARISON:  12/29/2020 FINDINGS: Interval CABG. Swan-Ganz catheter with the tip projecting over the right ventricular outflow tract. Bilateral chest tubes. Endotracheal tube with the tip 4.4 cm above the carina. Nasogastric tube coiled in the stomach. Mediastinal drains are noted. No focal consolidation. Trace left pleural effusion. No right pleural effusion. No pneumothorax. Stable cardiomediastinal silhouette. Small volume pneumopericardium likely postsurgical. No acute osseous abnormality. IMPRESSION: 1. Interval CABG. Support lines and tubing in satisfactory position. 2. Trace left pleural effusion. 3. No pneumothorax. 4. Small volume pneumopericardium likely postsurgical. Electronically Signed   By: Elige Ko   On: 01/01/2021 14:48   ECHO INTRAOPERATIVE TEE  Result Date: 01/02/2021  *INTRAOPERATIVE TRANSESOPHAGEAL REPORT *  Patient Name:   SANDERS MANNINEN Date of Exam: 01/01/2021 Medical Rec #:  [id number]      Height:       68.0 in Accession #:    [id number]     Weight:       192.0 lb Date of Birth:  June 11, 1946       BSA:          2.01 m Patient Age:    75 years       BP:           146/76 mmHg Patient Gender: M              HR:           66 bpm. Exam Location:  Anesthesiology Transesophogeal exam was perform intraoperatively during surgical procedure. Patient was closely monitored under general anesthesia during the entirety of  examination. Indications:     Coronary artery disease involving native heart without angina                  pectoris, unspecified vessel or lesion type [I25.10] Sonographer:     DCS Performing Phys: [id number] BARTLE Diagnosing Phys: Arrie Aran MD Complications: No known complications during this procedure. POST-OP IMPRESSIONS - Left Ventricle: The cavity size was normal. The wall motion is normal. - Right Ventricle: The right ventricle appears unchanged from pre-bypass. - Aorta: The aorta appears unchanged from pre-bypass. - Left Atrial Appendage: The left atrial appendage appears unchanged from pre-bypass. - Aortic Valve: The aortic valve appears unchanged from pre-bypass. - Mitral Valve: The mitral valve appears unchanged from pre-bypass. - Tricuspid Valve: The tricuspid valve appears unchanged from pre-bypass. - Pulmonic Valve: The pulmonic valve appears unchanged from pre-bypass. - Interatrial Septum: The interatrial septum appears unchanged from pre-bypass. - Comments: Post-bypass images reviewed with surgeon. PRE-OP FINDINGS  Left  Ventricle: The left ventricle has normal systolic function, with an ejection fraction of 55-60%. The cavity size was normal. There is mildly increased left ventricular wall thickness. There is mild concentric left ventricular hypertrophy. Right Ventricle: The right ventricle has normal systolic function. The cavity was dialated. There is no increase in right ventricular wall thickness. Left Atrium: Left atrial size was dilated. No left atrial/left atrial appendage thrombus was detected. Left atrial appendage velocity is reduced at less than 40 cm/s. Right Atrium: Right atrial size was dilated. Interatrial Septum: No atrial level shunt detected by color flow Doppler. Pericardium: There is no evidence of pericardial effusion. Mitral Valve: The mitral valve is normal in structure. Mitral valve regurgitation is trivial by color flow Doppler. There is No evidence of mitral stenosis.  There is mild thickening and mild calcification present. Tricuspid Valve: The tricuspid valve was normal in structure. Tricuspid valve regurgitation is trivial by color flow Doppler. Aortic Valve: The aortic valve is tricuspid Aortic valve regurgitation was not visualized by color flow Doppler. There is mild stenosis of the aortic valve, with a calculated valve area of 2.11 cm. There is mild thickening and mild calcification present on the aortic valve right coronary and non-coronary cusps with moderately decreased mobility. Pulmonic Valve: The pulmonic valve was normal in structure. Pulmonic valve regurgitation is trivial by color flow Doppler. Aorta: The aortic root, ascending aorta and aortic arch are normal in size and structure. There is evidence of plaque in the descending aorta. Pulmonary Artery: The pulmonary artery is of normal size. +--------------+--------++ LEFT VENTRICLE         +--------------+--------++ PLAX 2D                +--------------+--------++ LVOT diam:    2.30 cm  +--------------+--------++ LVOT Area:    4.15 cm +--------------+--------++                        +--------------+--------++ +------------------+------------++ AORTIC VALVE                   +------------------+------------++ AV Area (Vmax):   1.98 cm     +------------------+------------++ AV Area (Vmean):  2.10 cm     +------------------+------------++ AV Area (VTI):    2.11 cm     +------------------+------------++ AV Vmax:          220.00 cm/s  +------------------+------------++ AV Vmean:         157.000 cm/s +------------------+------------++ AV VTI:           0.584 m      +------------------+------------++ AV Peak Grad:     19.4 mmHg    +------------------+------------++ AV Mean Grad:     11.0 mmHg    +------------------+------------++ LVOT Vmax:        105.00 cm/s  +------------------+------------++ LVOT Vmean:       79.200 cm/s   +------------------+------------++ LVOT VTI:         0.297 m      +------------------+------------++ LVOT/AV VTI ratio:0.51         +------------------+------------++  +--------------+-------+ SHUNTS                +--------------+-------+ Systemic VTI: 0.30 m  +--------------+-------+ Systemic Diam:2.30 cm +--------------+-------+  Arrie Aran MD Electronically signed by Arrie Aran MD Signature Date/Time: 01/02/2021/1:31:27 PM    Final    VAS US DOPPLER PRE CABG  Result Date: 12/31/2020 PREOPERATIVE VASCULAR EVALUATION  Indications:      Pre-CABG. Risk Factors:  Hypertension, Diabetes, coronary artery disease. Comparison Study: none Performing Technologist: Jeb Levering RDMS, RVT  Examination Guidelines: A complete evaluation includes B-mode imaging, spectral Doppler, color Doppler, and power Doppler as needed of all accessible portions of each vessel. Bilateral testing is considered an integral part of a complete examination. Limited examinations for reoccurring indications may be performed as noted.  Right Carotid Findings: +----------+--------+--------+--------+------------+--------+           PSV cm/sEDV cm/sStenosisDescribe    Comments +----------+--------+--------+--------+------------+--------+ CCA Prox  63      13                                   +----------+--------+--------+--------+------------+--------+ CCA Distal63      18                                   +----------+--------+--------+--------+------------+--------+ ICA Prox  93      19      1-39%   heterogenous         +----------+--------+--------+--------+------------+--------+ ICA Distal100     25                                   +----------+--------+--------+--------+------------+--------+ ECA       142     12                                   +----------+--------+--------+--------+------------+--------+ Portions of this table do not appear on this page.  +----------+--------+-------+----------------+------------+           PSV cm/sEDV cmsDescribe        Arm Pressure +----------+--------+-------+----------------+------------+ Subclavian191            Multiphasic, WNL             +----------+--------+-------+----------------+------------+ +---------+--------+--+--------+--+---------+ VertebralPSV cm/s43EDV cm/s15Antegrade +---------+--------+--+--------+--+---------+ Left Carotid Findings: +----------+--------+--------+--------+------------+--------+           PSV cm/sEDV cm/sStenosisDescribe    Comments +----------+--------+--------+--------+------------+--------+ CCA Prox  77      22                                   +----------+--------+--------+--------+------------+--------+ CCA Distal78      16                                   +----------+--------+--------+--------+------------+--------+ ICA Prox  91      30      1-39%   heterogenous         +----------+--------+--------+--------+------------+--------+ ICA Distal59      26                                   +----------+--------+--------+--------+------------+--------+ ECA       256     39                                   +----------+--------+--------+--------+------------+--------+ +----------+--------+--------+----------------+------------+ SubclavianPSV cm/sEDV cm/sDescribe        Arm Pressure +----------+--------+--------+----------------+------------+  141             Multiphasic, WNL             +----------+--------+--------+----------------+------------+ +---------+--------+--+--------+-+---------+ VertebralPSV cm/s39EDV cm/s8Antegrade +---------+--------+--+--------+-+---------+  ABI Findings: +--------+------------------+-----+---------+--------+ Right   Rt Pressure (mmHg)IndexWaveform Comment  +--------+------------------+-----+---------+--------+ Brachial120                    triphasic          +--------+------------------+-----+---------+--------+ ATA     149               1.20 triphasic         +--------+------------------+-----+---------+--------+ PTA     154               1.24 triphasic         +--------+------------------+-----+---------+--------+ +--------+------------------+-----+---------+-------+ Left    Lt Pressure (mmHg)IndexWaveform Comment +--------+------------------+-----+---------+-------+ [id number]                    triphasic        +--------+------------------+-----+---------+-------+ ATA     153               1.23 triphasic        +--------+------------------+-----+---------+-------+ PTA     166               1.34 triphasic        +--------+------------------+-----+---------+-------+  Right Doppler Findings: +--------+--------+-----+---------+--------+ Site    PressureIndexDoppler  Comments +--------+--------+-----+---------+--------+ Brachial120          triphasic         +--------+--------+-----+---------+--------+ Radial               triphasic         +--------+--------+-----+---------+--------+ Ulnar                triphasic         +--------+--------+-----+---------+--------+  Left Doppler Findings: +--------+--------+-----+---------+--------+ Site    PressureIndexDoppler  Comments +--------+--------+-----+---------+--------+ [id number]          triphasic         +--------+--------+-----+---------+--------+ Radial               triphasic         +--------+--------+-----+---------+--------+ Ulnar                triphasic         +--------+--------+-----+---------+--------+  Summary: Right Carotid: Velocities in the right ICA are consistent with a 1-39% stenosis. Left Carotid: Velocities in the left ICA are consistent with a 1-39% stenosis. Vertebrals: Bilateral vertebral arteries demonstrate antegrade flow. Right ABI: Resting right ankle-brachial index is within normal range. No evidence of  significant right lower extremity arterial disease. Left ABI: Resting left ankle-brachial index is within normal range. No evidence of significant left lower extremity arterial disease. Right Upper Extremity: Doppler waveforms decrease >50% with right radial compression. Doppler waveforms remain within normal limits with right ulnar compression. Left Upper Extremity: Doppler waveforms decrease >50% with left radial compression. Doppler waveforms remain within normal limits with left ulnar compression.  Electronically signed by Fabienne Bruns MD on 12/31/2020 at 11:14:44 AM.    Final      Discharge Medications: Allergies as of 01/06/2021   No Known Allergies     Medication List    STOP taking these medications   acetaminophen 500 MG tablet Commonly known as: TYLENOL   amoxicillin 875 MG tablet Commonly known as: AMOXIL   aspirin 81 MG chewable tablet Replaced by: aspirin 325 MG tablet  losartan 25 MG tablet Commonly known as: COZAAR   nitroGLYCERIN 0.3 MG SL tablet Commonly known as: NITROSTAT     TAKE these medications   amiodarone 200 MG tablet Commonly known as: PACERONE Take 1 tablet (200 mg total) by mouth 2 (two) times daily.   aspirin 325 MG tablet Take 1 tablet (325 mg total) by mouth daily. Replaces: aspirin 81 MG chewable tablet   empagliflozin 25 MG Tabs tablet Commonly known as: JARDIANCE Take 25 mg by mouth daily.   ezetimibe 10 MG tablet Commonly known as: ZETIA Take 10 mg by mouth daily.   insulin aspart protamine- aspart (70-30) 100 UNIT/ML injection Commonly known as: NOVOLOG MIX 70/30 Inject 13 Units into the skin 3 (three) times daily.   insulin glargine 100 UNIT/ML injection Commonly known as: LANTUS Inject 53 Units into the skin daily. Every AM   metFORMIN 1000 MG tablet Commonly known as: GLUCOPHAGE Take 1,000 mg by mouth 2 (two) times daily with a meal.   metoprolol tartrate 25 MG tablet Commonly known as: LOPRESSOR Take 0.5 tablets  (12.5 mg total) by mouth 2 (two) times daily. What changed: how much to take   omeprazole 20 MG capsule Commonly known as: PRILOSEC Take 20 mg by mouth daily.   rosuvastatin 40 MG tablet Commonly known as: CRESTOR Take 40 mg by mouth at bedtime.   traMADol 50 MG tablet Commonly known as: ULTRAM Take 1 tablet (50 mg total) by mouth every 4 (four) hours as needed for moderate pain.   Vitamin D3 Super Strength 50 MCG (2000 UT) Caps Generic drug: Cholecalciferol Take 2,000 Units by mouth daily.            Durable Medical Equipment  (From admission, onward)         Start     Ordered   01/05/21 1200  For home use only DME Walker rolling  Once       Question Answer Comment  Walker: With 5 Inch Wheels   Patient needs a walker to treat with the following condition Weakness      01/05/21 1206         The patient has been discharged on:   1.Beta Blocker:  Yes [ x  ]                              No   [   ]                              If No, reason:  2.Ace Inhibitor/ARB: Yes [   ]                                     No  [ x   ]                                     If No, reason:Labile BP  3.Statin:   Yes [ x  ]                  No  [   ]                  If No, reason:  4.Ecasa:  Yes  [  x  ]                  No   [   ]                  If No, reason:  Follow Up Appointments:  Follow-up Information    Alleen BorneBartle, Bryan K, MD. Go on 02/07/2021.   Specialty: Cardiothoracic Surgery Why: PA/LAT CXR to be taken ([facility] which is in the same building as Dr. Sharee PimpleBartle's office) on 04/27 at 10:00 am;Appoitnment time is at 10:30 am Contact information: [address] [place] [address] [phone]        [facility]   Specialty: General Practice Why: Call for a follow up for further diabetes management and surveillance of HGA1C 8.5 Contact information: [address] [place] [phone]        [facility] . Go on 01/18/2021.   Specialty: Cardiothoracic Surgery Why: Appointment is with nurse only for chest tube suture removal. Appointment time is at  11:00 am Contact information: [address], [address] [place] [id number] [phone]       Dr. Hyacinth MeekerMiller (cardiologist) Follow up.   Why: Please call for a follow up appointment for 2 weeks after discharge       [facility].   Why: rolling walker as been requested - order has been sent to your primary care provider to be approved by the [facility] - once approved by the [facility] they will send to home              Signed: Carl Bestrin BarrettPA-C 01/06/2021, 7:46 AM

## 2021-01-01 NOTE — Anesthesia Procedure Notes (Addendum)
Arterial Line Insertion Start/End3/21/2022 6:45 AM, 01/01/2021 6:50 AM Performed by: Cecile Hearing, MD, Harder, Margurite Auerbach, CRNA, CRNA  Patient location: Pre-op. Preanesthetic checklist: patient identified, IV checked, site marked, risks and benefits discussed, surgical consent, monitors and equipment checked, pre-op evaluation, timeout performed and anesthesia consent Lidocaine 1% used for infiltration Left, radial was placed Catheter size: 20 G Hand hygiene performed , maximum sterile barriers used  and Seldinger technique used  Attempts: 1 Procedure performed without using ultrasound guided technique. Following insertion, dressing applied and Biopatch. Post procedure assessment: normal and unchanged  Patient tolerated the procedure well with no immediate complications. Additional procedure comments: Placed by Jory Ee, SRNA.

## 2021-01-01 NOTE — Anesthesia Procedure Notes (Signed)
Central Venous Catheter Insertion Performed by: Cecile Hearing, MD, anesthesiologist Start/End3/21/2022 6:55 AM, 01/01/2021 7:00 AM Patient location: Pre-op. Preanesthetic checklist: patient identified, IV checked, site marked, risks and benefits discussed, surgical consent, monitors and equipment checked, pre-op evaluation and timeout performed Position: Trendelenburg Hand hygiene performed  and maximum sterile barriers used  Total catheter length 100. PA cath was placed.Swan type:thermodilution PA Cath depth:48 Procedure performed without using ultrasound guided technique. Attempts: 1 Patient tolerated the procedure well with no immediate complications.

## 2021-01-01 NOTE — Transfer of Care (Signed)
Immediate Anesthesia Transfer of Care Note  Patient: Mitchell Peterson  Procedure(s) Performed: CORONARY ARTERY BYPASS GRAFTING (CABG) X FIVE USING LEFT INTERNAL MAMMARY ARTERY AND RIGHT LEG GREATER SAPHENOUS VEIN HARVESTED ENDOSCOPICALLY (N/A Chest) TRANSESOPHAGEAL ECHOCARDIOGRAM (TEE) (N/A )  Patient Location: ICU  Anesthesia Type:General  Level of Consciousness: sedated and Patient remains intubated per anesthesia plan  Airway & Oxygen Therapy: Patient remains intubated per anesthesia plan and Patient placed on Ventilator (see vital sign flow sheet for setting)  Post-op Assessment: Report given to RN and Post -op Vital signs reviewed and stable  Post vital signs: Reviewed and stable  Last Vitals:  Vitals Value Taken Time  BP    Temp 36.3 C 01/01/21 1405  Pulse 85 01/01/21 1405  Resp 14 01/01/21 1405  SpO2 100 % 01/01/21 1405  Vitals shown include unvalidated device data.  Last Pain:  Vitals:   01/01/21 0618  PainSc: 0-No pain         Complications: No complications documented.

## 2021-01-01 NOTE — Progress Notes (Signed)
Patient ID: Mitchell Peterson, male   DOB: 08-05-1946, 75 y.o.   MRN: [id number]  TCTS Evening Rounds:   Hemodynamically stable on 35 neo. CI = 2 Extubated but sleepy. Urine output good  CT output low  CBC    Component Value Date/Time   WBC 15.5 (H) 01/01/2021 1416   RBC 4.38 01/01/2021 1416   HGB 12.0 (L) 01/01/2021 1416   HGB 11.9 (L) 01/01/2021 1416   HCT 36.8 (L) 01/01/2021 1416   HCT 35.0 (L) 01/01/2021 1416   PLT 207 01/01/2021 1416   MCV 84.0 01/01/2021 1416   MCH 27.4 01/01/2021 1416   MCHC 32.6 01/01/2021 1416   RDW 13.2 01/01/2021 1416     BMET    Component Value Date/Time   NA 141 01/01/2021 1416   K 3.7 01/01/2021 1416   CL 101 01/01/2021 1239   CO2 23 12/29/2020 1630   GLUCOSE 112 (H) 01/01/2021 1239   BUN 19 01/01/2021 1239   CREATININE 0.40 (L) 01/01/2021 1239   CALCIUM 9.3 12/29/2020 1630   GFRNONAA >60 12/29/2020 1630     A/P:  Stable postop course. Continue current plans

## 2021-01-01 NOTE — Discharge Instructions (Signed)

## 2021-01-01 NOTE — Anesthesia Procedure Notes (Signed)
Central Venous Catheter Insertion Performed by: Catalina Gravel, MD, anesthesiologist Start/End3/21/2022 6:45 AM, 01/01/2021 6:55 AM Patient location: Pre-op. Preanesthetic checklist: patient identified, IV checked, site marked, risks and benefits discussed, surgical consent, monitors and equipment checked, pre-op evaluation, timeout performed and anesthesia consent Position: Trendelenburg Lidocaine 1% used for infiltration and patient sedated Hand hygiene performed , maximum sterile barriers used  and Seldinger technique used Catheter size: 9 Fr Central line was placed.MAC introducer Procedure performed using ultrasound guided technique. Ultrasound Notes:anatomy identified, needle tip was noted to be adjacent to the nerve/plexus identified, no ultrasound evidence of intravascular and/or intraneural injection and image(s) printed for medical record Attempts: 1 Following insertion, line sutured, dressing applied and Biopatch. Post procedure assessment: free fluid flow, blood return through all ports and no air  Patient tolerated the procedure well with no immediate complications.

## 2021-01-01 NOTE — Anesthesia Procedure Notes (Signed)
Procedure Name: Intubation Date/Time: 01/01/2021 7:38 AM Performed by: Kizzie Furnish, RN Pre-anesthesia Checklist: Patient identified, Emergency Drugs available, Suction available and Patient being monitored Patient Re-evaluated:Patient Re-evaluated prior to induction Oxygen Delivery Method: Circle system utilized Preoxygenation: Pre-oxygenation with 100% oxygen Induction Type: IV induction Ventilation: Mask ventilation without difficulty and Oral airway inserted - appropriate to patient size Laryngoscope Size: Glidescope and 3 Grade View: Grade I Tube type: Oral Tube size: 8.0 mm Number of attempts: 1 Airway Equipment and Method: Stylet and Oral airway Placement Confirmation: ETT inserted through vocal cords under direct vision,  positive ETCO2 and breath sounds checked- equal and bilateral Secured at: 24 cm Tube secured with: Tape Dental Injury: Teeth and Oropharynx as per pre-operative assessment  Comments: Anterior airway, straight to glidescope, grade 1 view with easy tube placement.

## 2021-01-01 NOTE — Op Note (Signed)
CARDIOVASCULAR SURGERY OPERATIVE NOTE  01/01/2021  Surgeon:  Alleen Borne, MD  First Assistant: Doree Fudge,  PA-C   Preoperative Diagnosis:  Severe multi-vessel coronary artery disease   Postoperative Diagnosis:  Same   Procedure:  1. Median Sternotomy 2. Extracorporeal circulation 3.   Coronary artery bypass grafting x 5   Left internal mammary artery graft to the LAD  SVG to diagonal  Sequential SVG to OM1 and OM2  SVG to PDA 4.   Endoscopic vein harvest from the right leg   Anesthesia:  General Endotracheal   Clinical History/Surgical Indication:  The patient is a 75 year old gentleman with history of hypertension, type 2 diabetes, and coronary artery disease status post myocardial infarction and remote stenting in 2000 and 2002 although the details of those procedures are unknown. He now presents with a 62-month history of exertional substernal chest pain that he described as sharp and stabbing and associated with exertion. This has been progressing over the past several months and occurring on a daily basis. He was evaluated at [facility] in January with chest pain and ruled out for myocardial infarction. He was discharged from the emergency room and subsequently had a Cardiolite stress test showing inferior, lateral, and apical ischemia with possible lateral infarct. He subsequently underwent cardiac catheterization by Dr. Hyacinth Meeker on 12/15/2020. This showed severe three-vessel coronary disease. The LAD had mild proximal disease and then was diffusely diseased after the first diagonal branch. The left circumflex had proximal 99% stenosis at the first marginal branch. Second marginal was large and had 75 to 80% stenosis. The right coronary artery was a dominant vessel with 25 to 30% proximal stenosis and distal 75% stenosis. Left ventriculogram showed low normal  left ventricular ejection fraction. Given his history of diabetes and multivessel coronary disease I agree that coronary bypass graft surgery is the best treatment for his coronary disease.I discussed the operative procedure with the patient andhis brotherincluding alternatives, benefits and risks; including but not limited to bleeding, blood transfusion, infection, stroke, myocardial infarction, graft failure, heart block requiring a permanent pacemaker, organ dysfunction, and death. Abyan Piercyunderstands and agrees to proceed.    Preparation:  The patient was seen in the preoperative holding area and the correct patient, correct operation were confirmed with the patient after reviewing the medical record and catheterization. The consent was signed by me. Preoperative antibiotics were given. A pulmonary arterial line and radial arterial line were placed by the anesthesia team. The patient was taken back to the operating room and positioned supine on the operating room table. After being placed under general endotracheal anesthesia by the anesthesia team a foley catheter was placed. The neck, chest, abdomen, and both legs were prepped with betadine soap and solution and draped in the usual sterile manner. A surgical time-out was taken and the correct patient and operative procedure were confirmed with the nursing and anesthesia staff.  TEE: performed by Dr. Desmond Lope. This showed a calcified aortic valve with a mean gradient of 11 mm Hg consistent with mild aortic stenosis. Normal LV systolic function. I reviewed the echo and his aortic valve leaflets have fairly good mobility so I think it is reasonable to leave his aortic valve alone and continue periodic followup. When he develops progressive aortic stenosis it could most likely be treated with TAVR.   Cardiopulmonary Bypass:  A median sternotomy was performed. The pericardium was opened in the midline. Right ventricular function appeared normal.  The ascending aorta was of normal size and had no  palpable plaque. There were no contraindications to aortic cannulation or cross-clamping. The patient was fully systemically heparinized and the ACT was maintained > 400 sec. The proximal aortic arch was cannulated with a 20 F aortic cannula for arterial inflow. Venous cannulation was performed via the right atrial appendage using a two-staged venous cannula. An antegrade cardioplegia/vent cannula was inserted into the mid-ascending aorta. Aortic occlusion was performed with a single cross-clamp. Systemic cooling to 32 degrees Centigrade and topical cooling of the heart with iced saline were used. Hyperkalemic antegrade cold blood cardioplegia was used to induce diastolic arrest and was then given at about 20 minute intervals throughout the period of arrest to maintain myocardial temperature at or below 10 degrees centigrade. A temperature probe was inserted into the interventricular septum and an insulating pad was placed in the pericardium.   Left internal mammary artery harvest:  The left side of the sternum was retracted using the Rultract retractor. The left internal mammary artery was harvested as a pedicle graft. All side branches were clipped. It was a medium-sized vessel of good quality with excellent blood flow. It was ligated distally and divided. It was sprayed with topical papaverine solution to prevent vasospasm.   Endoscopic vein harvest:  The right greater saphenous vein was harvested endoscopically through a 2 cm incision medial to the right knee. It was harvested from the upper thigh to below the knee. It was a medium-sized vein of good quality. The side branches were all ligated with 4-0 silk ties.    Coronary arteries:  The coronary arteries were examined.   LAD:  Large vessel with segmental plaque extending to the apex. Diagonal was a medium caliber vessel with proximal plaque but non distally.  LCX:  OM1 was visible very  proximally and then became intramyocardial. It was diseased proximally and therefore exposed as it entered the muscle where it was a large graftable vessel with no disease. The OM2 was also a large graftable vessel with no distal disease.  RCA:  Diffusely diseased. The PDA was a medium caliber vessel with no disease.    Grafts:  1. LIMA to the LAD: 2.0 mm. It was sewn end to side using 8-0 prolene continuous suture. 2. SVG to diagonal:  1.6 mm. It was sewn end to side using 7-0 prolene continuous suture. 3. Sequential SVG to OM1:  1.75 mm. It was sewn sequential side to side using 7-0 prolene continuous suture. 4.   Sequential SVG to OM2:  1.75 mm. It was sewn sequential end to side using 7-0 prolene continuous suture.  5.   SVG to PDA:  1.6 mm. It was sewn end to side using 7-0 prolene continuous suture.  The proximal vein graft anastomoses were performed to the mid-ascending aorta using continuous 6-0 prolene suture. Graft markers were placed around the proximal anastomoses.   Completion:  The patient was rewarmed to 37 degrees Centigrade. The clamp was removed from the LIMA pedicle and there was rapid warming of the septum and return of ventricular fibrillation. The crossclamp was removed with a time of 97 minutes. There was spontaneous return of sinus rhythm. The distal and proximal anastomoses were checked for hemostasis. The position of the grafts was satisfactory. Two temporary epicardial pacing wires were placed on the right atrium and two on the right ventricle. The patient was weaned from CPB without difficulty on no inotropes. CPB time was 117 minutes. Cardiac output was 5 LPM. TEE showed normal LV systolic function. Heparin was fully reversed  with protamine and the aortic and venous cannulas removed. Hemostasis was achieved. Mediastinal and left pleural drainage tubes were placed. The sternum was closed with double #6 stainless steel wires. The fascia was closed with continuous # 1  vicryl suture. The subcutaneous tissue was closed with 2-0 vicryl continuous suture. The skin was closed with 3-0 vicryl subcuticular suture. All sponge, needle, and instrument counts were reported correct at the end of the case. Dry sterile dressings were placed over the incisions and around the chest tubes which were connected to pleurevac suction. The patient was then transported to the surgical intensive care unit in stable condition.

## 2021-01-01 NOTE — Brief Op Note (Signed)
01/01/2021  11:40 AM  PATIENT:  Mitchell Peterson  75 y.o. male  PRE-OPERATIVE DIAGNOSIS:  Coronary artery disease  POST-OPERATIVE DIAGNOSIS:  Coronary artery disease  PROCEDURE: TRANSESOPHAGEAL ECHOCARDIOGRAM (TEE), CORONARY ARTERY BYPASS GRAFTING (CABG) X FIVE (LIMA to LAD, SVG to DIAGONAL, SVG SEQUENTIALLY to OM1 and OM2, SVG to PDA) USING LEFT INTERNAL MAMMARY ARTERY AND RIGHT LEG GREATER SAPHENOUS VEIN HARVESTED ENDOSCOPICALLY   SURGEON:  Surgeon(s) and Role:    Alleen Borne, MD - Primary  PHYSICIAN ASSISTANT: Doree Fudge PA-C  ANESTHESIA:   general  EBL: Per anesthesia and perfusion record  DRAINS: Chest tubes placed in the mediastinal and pleural spaces   COUNTS CORRECT:  YES  DICTATION: .Dragon Dictation  PLAN OF CARE: Admit to inpatient   PATIENT DISPOSITION:  ICU - intubated and hemodynamically stable.   Delay start of Pharmacological VTE agent (>24hrs) due to surgical blood loss or risk of bleeding: yes  BASELINE WEIGHT: 87.1 kg

## 2021-01-01 NOTE — H&P (Signed)
[address]       [place] [address]             [phone]      Cardiothoracic Surgery Admission History and Physical   PCP is [facility] Referring Cardiologist is Daryel November, MD      Chief Complaint  Patient presents with   Coronary Artery Disease        HPI:  The patient is a 75 year old gentleman with history of hypertension, type 2 diabetes, and coronary artery disease status post myocardial infarction and remote stenting in 2000 and 2002 although the details of those procedures are unknown.  He now presents with a 38-month history of exertional substernal chest pain that he described as sharp and stabbing and associated with exertion.  This has been progressing over the past several months and occurring on a daily basis.  He was evaluated at [facility] in January with chest pain and ruled out for myocardial infarction.  He was discharged from the emergency room and subsequently had a Cardiolite stress test showing inferior, lateral, and apical ischemia with possible lateral infarct.  He subsequently underwent cardiac catheterization by Dr. Hyacinth Meeker on 12/15/2020.  This showed severe three-vessel coronary disease.  The LAD had mild proximal disease and then was diffusely diseased after the first diagonal branch.  The left circumflex had proximal 99% stenosis at the first marginal branch.  Second marginal was large and had 75 to 80% stenosis.  The right coronary artery was a dominant vessel with 25 to 30% proximal stenosis and distal 75% stenosis.  Left ventriculogram showed low normal left ventricular ejection fraction.  His wife is not in the best health and he takes care of her at home.  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  He receives most of his care at the [facility] in [place] or [place] and sees Dr. Hyacinth Meeker for his cardiology care.      Past Medical  History:  Diagnosis Date   Coronary atherosclerosis    Degenerative joint disease    Diabetes mellitus without complication (HCC)    type 2   Hypertension    Myocardial infarction (HCC)    stents placed   Vitamin D deficiency          Past Surgical History:  Procedure Laterality Date   CARDIAC CATHETERIZATION           Family History  Problem Relation Age of Onset   Heart disease Father    Diabetes Father    Stroke Father    Hypertension Father     Social History Social History       Tobacco Use   Smoking status: Former Smoker   Smokeless tobacco: Never Used  Substance Use Topics   Alcohol use: Not Currently   Drug use: Never          Current Outpatient Medications  Medication Sig Dispense Refill   empagliflozin (JARDIANCE) 25 MG TABS tablet Take 25 mg by mouth daily.     ezetimibe (ZETIA) 10 MG tablet Take 10 mg by mouth daily.     insulin aspart (NOVOLOG) 100 UNIT/ML injection Inject 10 Units into the skin 3 (three) times daily before meals.     insulin glargine (LANTUS) 100 UNIT/ML injection Inject 25 Units into the skin daily. Every AM  metoprolol tartrate (LOPRESSOR) 25 MG tablet Take 25 mg by mouth 2 (two) times daily.     metFORMIN (GLUCOPHAGE) 1000 MG tablet Take 1,000 mg by mouth 2 (two) times daily with a meal. (Patient not taking: Reported on 12/28/2020)     No current facility-administered medications for this visit.    No Known Allergies  Review of Systems  Constitutional: Positive for activity change and fatigue.  HENT: Positive for hearing loss.        Has hearing aids  Eyes: Negative.   Respiratory: Positive for shortness of breath.   Cardiovascular: Positive for chest pain. Negative for leg swelling.  Gastrointestinal: Negative.        Hiatal hernia  Endocrine: Negative.   Genitourinary: Negative.   Musculoskeletal: Positive for arthralgias.  Skin: Negative.    Neurological: Negative.  Negative for dizziness and syncope.  Hematological: Negative.   Psychiatric/Behavioral: Negative.     BP 109/73 (BP Location: Left Arm, Patient Position: Sitting)    Pulse 74    Resp 20    Ht 5\' 8"  (1.727 m)    Wt 196 lb (88.9 kg)    SpO2 96% Comment: RA   BMI 29.80 kg/m  Physical Exam Constitutional:      Appearance: Normal appearance. He is normal weight.  HENT:     Head: Normocephalic and atraumatic.     Nose: Nose normal.  Eyes:     Extraocular Movements: Extraocular movements intact.     Conjunctiva/sclera: Conjunctivae normal.     Pupils: Pupils are equal, round, and reactive to light.  Neck:     Vascular: No carotid bruit.  Cardiovascular:     Rate and Rhythm: Normal rate and regular rhythm.     Pulses: Normal pulses.     Heart sounds: Normal heart sounds. No murmur heard.   Pulmonary:     Effort: Pulmonary effort is normal.     Breath sounds: Normal breath sounds.  Abdominal:     General: Abdomen is flat.     Palpations: Abdomen is soft.  Musculoskeletal:        General: No swelling. Normal range of motion.     Cervical back: Normal range of motion and neck supple.  Skin:    General: Skin is warm and dry.  Neurological:     General: No focal deficit present.     Mental Status: He is alert and oriented to person, place, and time.  Psychiatric:        Mood and Affect: Mood normal.        Behavior: Behavior normal.      Diagnostic Tests:  Cardiac catheterization from 12/15/2020 done at [facility] was reviewed on disc  Impression:  This 75 year old gentleman has severe three-vessel coronary disease with progressive anginal symptoms.  His left ventricular function is normal.  Given his history of diabetes and multivessel coronary disease I agree that coronary bypass graft surgery is the best treatment for his coronary disease. I discussed the operative procedure with the patient and his brother including  alternatives, benefits and risks; including but not limited to bleeding, blood transfusion, infection, stroke, myocardial infarction, graft failure, heart block requiring a permanent pacemaker, organ dysfunction, and death.  Armand Preast understands and agrees to proceed.    Plan:  Coronary bypass graft surgery.   Adline Peals, MD [facility] [phone]

## 2021-01-01 NOTE — Progress Notes (Signed)
NIF > -30, VC > 69mL/kg, Cuff leak positive, no stridor noted.

## 2021-01-01 NOTE — Interval H&P Note (Signed)
History and Physical Interval Note:  01/01/2021 7:08 AM  Mitchell Peterson  has presented today for surgery, with the diagnosis of CAD.  The various methods of treatment have been discussed with the patient and family. After consideration of risks, benefits and other options for treatment, the patient has consented to  Procedure(s): CORONARY ARTERY BYPASS GRAFTING (CABG) (N/A) TRANSESOPHAGEAL ECHOCARDIOGRAM (TEE) (N/A) as a surgical intervention.  The patient's history has been reviewed, patient examined, no change in status, stable for surgery.  I have reviewed the patient's chart and labs.  Questions were answered to the patient's satisfaction.     Alleen Borne

## 2021-01-02 ENCOUNTER — Inpatient Hospital Stay (HOSPITAL_COMMUNITY): Payer: No Typology Code available for payment source

## 2021-01-02 ENCOUNTER — Encounter (HOSPITAL_COMMUNITY): Payer: Self-pay | Admitting: Surgery

## 2021-01-02 LAB — POCT I-STAT 7, (LYTES, BLD GAS, ICA,H+H)
Acid-base deficit: 5 mmol/L — ABNORMAL HIGH (ref 0.0–2.0)
Bicarbonate: 21.4 mmol/L (ref 20.0–28.0)
Calcium, Ion: 1.16 mmol/L (ref 1.15–1.40)
HCT: 30 % — ABNORMAL LOW (ref 39.0–52.0)
Hemoglobin: 10.2 g/dL — ABNORMAL LOW (ref 13.0–17.0)
O2 Saturation: 98 %
Patient temperature: 37.6
Potassium: 4.6 mmol/L (ref 3.5–5.1)
Sodium: 137 mmol/L (ref 135–145)
TCO2: 23 mmol/L (ref 22–32)
pCO2 arterial: 45 mmHg (ref 32.0–48.0)
pH, Arterial: 7.288 — ABNORMAL LOW (ref 7.350–7.450)
pO2, Arterial: 129 mmHg — ABNORMAL HIGH (ref 83.0–108.0)

## 2021-01-02 LAB — MAGNESIUM
Magnesium: 2.3 mg/dL (ref 1.7–2.4)
Magnesium: 2.3 mg/dL (ref 1.7–2.4)

## 2021-01-02 LAB — CBC
HCT: 31.6 % — ABNORMAL LOW (ref 39.0–52.0)
HCT: 28 % — ABNORMAL LOW (ref 39.0–52.0)
Hemoglobin: 10 g/dL — ABNORMAL LOW (ref 13.0–17.0)
Hemoglobin: 9.1 g/dL — ABNORMAL LOW (ref 13.0–17.0)
MCH: 27.4 pg (ref 26.0–34.0)
MCH: 28 pg (ref 26.0–34.0)
MCHC: 31.6 g/dL (ref 30.0–36.0)
MCHC: 32.5 g/dL (ref 30.0–36.0)
MCV: 86.6 fL (ref 80.0–100.0)
MCV: 86.2 fL (ref 80.0–100.0)
Platelets: 230 10*3/uL (ref 150–400)
Platelets: 213 10*3/uL (ref 150–400)
RBC: 3.65 MIL/uL — ABNORMAL LOW (ref 4.22–5.81)
RBC: 3.25 MIL/uL — ABNORMAL LOW (ref 4.22–5.81)
RDW: 13.5 % (ref 11.5–15.5)
RDW: 13.6 % (ref 11.5–15.5)
WBC: 13.2 10*3/uL — ABNORMAL HIGH (ref 4.0–10.5)
WBC: 13.1 10*3/uL — ABNORMAL HIGH (ref 4.0–10.5)
nRBC: 0 % (ref 0.0–0.2)
nRBC: 0 % (ref 0.0–0.2)

## 2021-01-02 LAB — BASIC METABOLIC PANEL
Anion gap: 12 (ref 5–15)
Anion gap: 7 (ref 5–15)
BUN: 17 mg/dL (ref 8–23)
BUN: 21 mg/dL (ref 8–23)
CO2: 20 mmol/L — ABNORMAL LOW (ref 22–32)
CO2: 26 mmol/L (ref 22–32)
Calcium: 7.8 mg/dL — ABNORMAL LOW (ref 8.9–10.3)
Calcium: 7.9 mg/dL — ABNORMAL LOW (ref 8.9–10.3)
Chloride: 104 mmol/L (ref 98–111)
Chloride: 101 mmol/L (ref 98–111)
Creatinine, Ser: 0.93 mg/dL (ref 0.61–1.24)
Creatinine, Ser: 0.62 mg/dL (ref 0.61–1.24)
GFR, Estimated: >60 mL/min (ref >60)
GFR, Estimated: >60 mL/min (ref >60)
Glucose, Bld: 156 mg/dL — ABNORMAL HIGH (ref 70–99)
Glucose, Bld: 166 mg/dL — ABNORMAL HIGH (ref 70–99)
Potassium: 4.4 mmol/L (ref 3.5–5.1)
Potassium: 4.4 mmol/L (ref 3.5–5.1)
Sodium: 136 mmol/L (ref 135–145)
Sodium: 134 mmol/L — ABNORMAL LOW (ref 135–145)

## 2021-01-02 LAB — GLUCOSE, CAPILLARY
Glucose-Capillary: 128 mg/dL — ABNORMAL HIGH (ref 70–99)
Glucose-Capillary: 138 mg/dL — ABNORMAL HIGH (ref 70–99)
Glucose-Capillary: 144 mg/dL — ABNORMAL HIGH (ref 70–99)
Glucose-Capillary: 155 mg/dL — ABNORMAL HIGH (ref 70–99)
Glucose-Capillary: 168 mg/dL — ABNORMAL HIGH (ref 70–99)
Glucose-Capillary: 170 mg/dL — ABNORMAL HIGH (ref 70–99)
Glucose-Capillary: 195 mg/dL — ABNORMAL HIGH (ref 70–99)

## 2021-01-02 LAB — ECHO INTRAOPERATIVE TEE
AR max vel: 1.98 cm2
AV Area VTI: 2.11 cm2
AV Area mean vel: 2.1 cm2
AV Mean grad: 11 mmHg
AV Peak grad: 19.4 mmHg
Ao pk vel: 2.2 m/s
Height: 68 in
Weight: 3072 oz

## 2021-01-02 MED ORDER — INSULIN DETEMIR 100 UNIT/ML ~~LOC~~ SOLN
20.0000 [IU] | Freq: Every day | SUBCUTANEOUS | Status: DC
  Administered 2021-01-02 – 2021-01-06 (×5): 20 [IU] via SUBCUTANEOUS
  Filled 2021-01-02 (×5): qty 0.2

## 2021-01-02 MED ORDER — ORAL CARE MOUTH RINSE
15.0000 mL | Freq: Two times a day (BID) | OROMUCOSAL | Status: DC
  Administered 2021-01-02 – 2021-01-04 (×4): 15 mL via OROMUCOSAL

## 2021-01-02 MED ORDER — SODIUM BICARBONATE 8.4 % IV SOLN
50.0000 meq | Freq: Once | INTRAVENOUS | Status: AC
  Administered 2021-01-02: 50 meq via INTRAVENOUS
  Filled 2021-01-02: qty 50

## 2021-01-02 MED ORDER — MIDODRINE HCL 5 MG PO TABS
10.0000 mg | ORAL_TABLET | Freq: Three times a day (TID) | ORAL | Status: DC
  Administered 2021-01-02: 10 mg via ORAL
  Filled 2021-01-02: qty 2

## 2021-01-02 MED ORDER — SODIUM CHLORIDE 0.9% FLUSH
10.0000 mL | Freq: Two times a day (BID) | INTRAVENOUS | Status: DC
  Administered 2021-01-02 – 2021-01-03 (×3): 10 mL

## 2021-01-02 MED ORDER — SODIUM CHLORIDE 0.9% FLUSH
10.0000 mL | INTRAVENOUS | Status: DC | PRN
Start: 2021-01-02 — End: 2021-01-03

## 2021-01-02 MED FILL — Sodium Chloride IV Soln 0.9%: INTRAVENOUS | Qty: 2000 | Status: AC

## 2021-01-02 MED FILL — Potassium Chloride Inj 2 mEq/ML: INTRAVENOUS | Qty: 40 | Status: AC

## 2021-01-02 MED FILL — Magnesium Sulfate Inj 50%: INTRAMUSCULAR | Qty: 10 | Status: AC

## 2021-01-02 MED FILL — Heparin Sodium (Porcine) Inj 1000 Unit/ML: INTRAMUSCULAR | Qty: 30 | Status: AC

## 2021-01-02 MED FILL — Electrolyte-R (PH 7.4) Solution: INTRAVENOUS | Qty: 4000 | Status: AC

## 2021-01-02 MED FILL — Sodium Bicarbonate IV Soln 8.4%: INTRAVENOUS | Qty: 50 | Status: AC

## 2021-01-02 MED FILL — Mannitol IV Soln 20%: INTRAVENOUS | Qty: 500 | Status: AC

## 2021-01-02 MED FILL — Lidocaine HCl Local Soln Prefilled Syringe 100 MG/5ML (2%): INTRAMUSCULAR | Qty: 5 | Status: AC

## 2021-01-02 MED FILL — Thrombin (Recombinant) For Soln 20000 Unit: CUTANEOUS | Qty: 1 | Status: AC

## 2021-01-02 NOTE — Anesthesia Postprocedure Evaluation (Signed)
Anesthesia Post Note  Patient: Arnaldo Heffron  Procedure(s) Performed: CORONARY ARTERY BYPASS GRAFTING (CABG) X FIVE USING LEFT INTERNAL MAMMARY ARTERY AND RIGHT LEG GREATER SAPHENOUS VEIN HARVESTED ENDOSCOPICALLY (N/A Chest) TRANSESOPHAGEAL ECHOCARDIOGRAM (TEE) (N/A )     Patient location during evaluation: SICU Anesthesia Type: General Level of consciousness: sedated Pain management: pain level controlled Vital Signs Assessment: post-procedure vital signs reviewed and stable Respiratory status: patient remains intubated per anesthesia plan Cardiovascular status: stable Postop Assessment: no apparent nausea or vomiting Anesthetic complications: no   No complications documented.  Last Vitals:  Vitals:   01/02/21 2100 01/02/21 2200  BP: 107/62 126/65  Pulse: 88 91  Resp: 17 20  Temp:    SpO2: 91% 93%    Last Pain:  Vitals:   01/02/21 1944  TempSrc: Oral  PainSc:                  Cecile Hearing

## 2021-01-02 NOTE — Progress Notes (Signed)
EVENING ROUNDS NOTE :     [address]       [place] [address]             [phone]                 1 Day Post-Op Procedure(s) (LRB): CORONARY ARTERY BYPASS GRAFTING (CABG) X FIVE USING LEFT INTERNAL MAMMARY ARTERY AND RIGHT LEG GREATER SAPHENOUS VEIN HARVESTED ENDOSCOPICALLY (N/A) TRANSESOPHAGEAL ECHOCARDIOGRAM (TEE) (N/A)   Total Length of Stay:  LOS: 1 day  Events:   Ambulated today Weaning neo Ct out    BP (!) 104/56   Pulse 89   Temp 98.3 F (36.8 C) (Oral)   Resp 19   Ht 5\' 8"  (1.727 m)   Wt 88.5 kg   SpO2 95%   BMI 29.67 kg/m   PAP: (22-39)/(13-29) 34/25 CO:  [4.5 L/min-5.6 L/min] 5.6 L/min CI:  [2.2 L/min/m2-2.8 L/min/m2] 2.8 L/min/m2     . sodium chloride    . sodium chloride    . sodium chloride 10 mL/hr at 01/01/21 1428  . cefUROXime (ZINACEF)  IV Stopped (01/02/21 1533)  . lactated ringers    . lactated ringers 20 mL/hr at 01/02/21 1621  . phenylephrine (NEO-SYNEPHRINE) Adult infusion 5 mcg/min (01/02/21 1823)    I/O last 3 completed shifts: In: 4865.7 [I.V.:2775.9; Blood:100; IV Piggyback:1989.8] Out: 4195 [Urine:3345; Chest Tube:850]   CBC Latest Ref Rng & Units 01/02/2021 01/02/2021 01/02/2021  WBC 4.0 - 10.5 K/uL 13.1(H) - 13.2(H)  Hemoglobin 13.0 - 17.0 g/dL 01/04/2021) 10.2(L) 10.0(L)  Hematocrit 39.0 - 52.0 % 28.0(L) 30.0(L) 31.6(L)  Platelets 150 - 400 K/uL 213 - 230    BMP Latest Ref Rng & Units 01/02/2021 01/02/2021 01/02/2021  Glucose 70 - 99 mg/dL 01/04/2021) - 497(W)  BUN 8 - 23 mg/dL 21 - 17  Creatinine 263(Z - 1.24 mg/dL 8.58 - 8.50  Sodium 2.77 - 145 mmol/L 134(L) 137 136  Potassium 3.5 - 5.1 mmol/L 4.4 4.6 4.4  Chloride 98 - 111 mmol/L 101 - 104  CO2 22 - 32 mmol/L 26 - 20(L)  Calcium 8.9 - 10.3 mg/dL 7.9(L) - 7.8(L)    ABG    Component Value Date/Time   PHART 7.288 (L) 01/02/2021 0359   PCO2ART 45.0 01/02/2021 0359   PO2ART 129 (H) 01/02/2021 0359   HCO3 21.4 01/02/2021 0359   TCO2 23 01/02/2021 0359    ACIDBASEDEF 5.0 (H) 01/02/2021 0359   O2SAT 98.0 01/02/2021 0359       01/04/2021, MD 01/02/2021 6:32 PM

## 2021-01-02 NOTE — Progress Notes (Signed)
1 Day Post-Op Procedure(s) (LRB): CORONARY ARTERY BYPASS GRAFTING (CABG) X FIVE USING LEFT INTERNAL MAMMARY ARTERY AND RIGHT LEG GREATER SAPHENOUS VEIN HARVESTED ENDOSCOPICALLY (N/A) TRANSESOPHAGEAL ECHOCARDIOGRAM (TEE) (N/A) Subjective: Complains of chest wall pain.  Objective: Vital signs in last 24 hours: Temp:  [97.3 F (36.3 C)-100.4 F (38 C)] 99.5 F (37.5 C) (03/22 0630) Pulse Rate:  [65-102] 91 (03/22 0630) Cardiac Rhythm: Normal sinus rhythm (03/21 2000) Resp:  [7-38] 16 (03/22 0630) BP: (73-162)/(49-150) 102/56 (03/22 0630) SpO2:  [92 %-100 %] 92 % (03/22 0630) Arterial Line BP: (62-110)/(39-70) 79/50 (03/22 0630) FiO2 (%):  [40 %-50 %] 40 % (03/21 1644)  Hemodynamic parameters for last 24 hours: PAP: (16-35)/(2-23) 31/19 CO:  [3.3 L/min-5.2 L/min] 4.9 L/min CI:  [1.6 L/min/m2-2.6 L/min/m2] 2.4 L/min/m2  Intake/Output from previous day: 03/21 0701 - 03/22 0700 In: 4865.7 [I.V.:2775.9; Blood:100; IV Piggyback:1989.8] Out: 4130 [Urine:3300; Chest Tube:830] Intake/Output this shift: Total I/O In: 1433.4 [I.V.:482; IV Piggyback:951.4] Out: 1395 [Urine:875; Chest Tube:520]  General appearance: alert and cooperative Neurologic: intact Heart: regular rate and rhythm Lungs: clear to auscultation bilaterally Extremities: edema mild Wound: dressings dry  Lab Results: Recent Labs    01/01/21 2020 01/02/21 0358 01/02/21 0359  WBC 17.3* 13.2*  --   HGB 10.8* 10.0* 10.2*  HCT 34.0* 31.6* 30.0*  PLT 230 230  --    BMET:  Recent Labs    01/01/21 2020 01/02/21 0358 01/02/21 0359  NA 135 136 137  K 5.0 4.4 4.6  CL 107 104  --   CO2 22 20*  --   GLUCOSE 162* 156*  --   BUN 14 17  --   CREATININE 0.67 0.93  --   CALCIUM 7.5* 7.8*  --     PT/INR:  Recent Labs    01/01/21 1416  LABPROT 16.2*  INR 1.4*   ABG    Component Value Date/Time   PHART 7.288 (L) 01/02/2021 0359   HCO3 21.4 01/02/2021 0359   TCO2 23 01/02/2021 0359   ACIDBASEDEF 5.0 (H)  01/02/2021 0359   O2SAT 98.0 01/02/2021 0359   CBG (last 3)  Recent Labs    01/01/21 2020 01/02/21 0030 01/02/21 0358  GLUCAP 155* 128* 138*   CXR: bibasilar atelectasis  ECG: pending  Assessment/Plan: S/P Procedure(s) (LRB): CORONARY ARTERY BYPASS GRAFTING (CABG) X FIVE USING LEFT INTERNAL MAMMARY ARTERY AND RIGHT LEG GREATER SAPHENOUS VEIN HARVESTED ENDOSCOPICALLY (N/A) TRANSESOPHAGEAL ECHOCARDIOGRAM (TEE) (N/A)  POD 1 Hemodynamically stable on neo 45 mcg to support BP. Wean as tolerated to keep MAP about 70 or greater. Hold beta blocker for now until stable off neo.  No wt yet. Hold on diuresis until stable off neo.  DM: preop Hgb A1c was 8.5 on multiple oral agents and insulin. Will start Levemir and continue SSI. Weaned off insulin drip quickly.  Chest tube output 50/hr serosanguinous. Will keep tubes this am. Dangle and OOB.  IS.   LOS: 1 day    Alleen Borne 01/02/2021

## 2021-01-02 NOTE — Progress Notes (Signed)
Assisted tele visit to patient with wife.  Thomas, Jesus Poplin Renee, RN   

## 2021-01-03 ENCOUNTER — Inpatient Hospital Stay (HOSPITAL_COMMUNITY): Payer: No Typology Code available for payment source

## 2021-01-03 LAB — BASIC METABOLIC PANEL
Anion gap: 10 (ref 5–15)
BUN: 20 mg/dL (ref 8–23)
CO2: 25 mmol/L (ref 22–32)
Calcium: 8.3 mg/dL — ABNORMAL LOW (ref 8.9–10.3)
Chloride: 100 mmol/L (ref 98–111)
Creatinine, Ser: 0.71 mg/dL (ref 0.61–1.24)
GFR, Estimated: >60 mL/min (ref >60)
Glucose, Bld: 154 mg/dL — ABNORMAL HIGH (ref 70–99)
Potassium: 4.2 mmol/L (ref 3.5–5.1)
Sodium: 135 mmol/L (ref 135–145)

## 2021-01-03 LAB — CBC
HCT: 30.2 % — ABNORMAL LOW (ref 39.0–52.0)
Hemoglobin: 9.9 g/dL — ABNORMAL LOW (ref 13.0–17.0)
MCH: 28.3 pg (ref 26.0–34.0)
MCHC: 32.8 g/dL (ref 30.0–36.0)
MCV: 86.3 fL (ref 80.0–100.0)
Platelets: 222 10*3/uL (ref 150–400)
RBC: 3.5 MIL/uL — ABNORMAL LOW (ref 4.22–5.81)
RDW: 13.7 % (ref 11.5–15.5)
WBC: 16.4 10*3/uL — ABNORMAL HIGH (ref 4.0–10.5)
nRBC: 0 % (ref 0.0–0.2)

## 2021-01-03 LAB — GLUCOSE, CAPILLARY
Glucose-Capillary: 148 mg/dL — ABNORMAL HIGH (ref 70–99)
Glucose-Capillary: 146 mg/dL — ABNORMAL HIGH (ref 70–99)
Glucose-Capillary: 180 mg/dL — ABNORMAL HIGH (ref 70–99)
Glucose-Capillary: 156 mg/dL — ABNORMAL HIGH (ref 70–99)
Glucose-Capillary: 221 mg/dL — ABNORMAL HIGH (ref 70–99)

## 2021-01-03 MED ORDER — ONDANSETRON HCL 4 MG/2ML IJ SOLN
4.0000 mg | Freq: Four times a day (QID) | INTRAMUSCULAR | Status: DC | PRN
  Administered 2021-01-03: 4 mg via INTRAVENOUS
  Filled 2021-01-03: qty 2

## 2021-01-03 MED ORDER — SODIUM CHLORIDE 0.9 % IV SOLN: 250.0000 mL | INTRAVENOUS | Status: DC | PRN

## 2021-01-03 MED ORDER — AMIODARONE HCL IN DEXTROSE 360-4.14 MG/200ML-% IV SOLN
30.0000 mg/h | INTRAVENOUS | Status: DC
  Administered 2021-01-04: 30 mg/h via INTRAVENOUS
  Filled 2021-01-03: qty 200

## 2021-01-03 MED ORDER — PANTOPRAZOLE SODIUM 40 MG PO TBEC
40.0000 mg | DELAYED_RELEASE_TABLET | Freq: Every day | ORAL | Status: DC
  Administered 2021-01-04 – 2021-01-06 (×3): 40 mg via ORAL
  Filled 2021-01-03 (×3): qty 1

## 2021-01-03 MED ORDER — TRAMADOL HCL 50 MG PO TABS
50.0000 mg | ORAL_TABLET | ORAL | Status: DC | PRN
  Administered 2021-01-05: 50 mg via ORAL
  Filled 2021-01-03: qty 1

## 2021-01-03 MED ORDER — SODIUM CHLORIDE 0.9% FLUSH
3.0000 mL | Freq: Two times a day (BID) | INTRAVENOUS | Status: DC
  Administered 2021-01-03 – 2021-01-05 (×5): 3 mL via INTRAVENOUS

## 2021-01-03 MED ORDER — SODIUM CHLORIDE 0.9% FLUSH: 3.0000 mL | INTRAVENOUS | Status: DC | PRN

## 2021-01-03 MED ORDER — FUROSEMIDE 40 MG PO TABS
40.0000 mg | ORAL_TABLET | Freq: Every day | ORAL | Status: AC
  Administered 2021-01-04 – 2021-01-06 (×3): 40 mg via ORAL
  Filled 2021-01-03 (×3): qty 1

## 2021-01-03 MED ORDER — MIDODRINE HCL 5 MG PO TABS
5.0000 mg | ORAL_TABLET | Freq: Three times a day (TID) | ORAL | Status: DC
  Administered 2021-01-03 – 2021-01-04 (×6): 5 mg via ORAL
  Filled 2021-01-03 (×6): qty 1

## 2021-01-03 MED ORDER — ONDANSETRON HCL 4 MG PO TABS: 4.0000 mg | ORAL_TABLET | Freq: Four times a day (QID) | ORAL | Status: DC | PRN

## 2021-01-03 MED ORDER — AMIODARONE LOAD VIA INFUSION
150.0000 mg | Freq: Once | INTRAVENOUS | Status: AC
  Administered 2021-01-03: 150 mg via INTRAVENOUS

## 2021-01-03 MED ORDER — INSULIN ASPART 100 UNIT/ML ~~LOC~~ SOLN
0.0000 [IU] | Freq: Three times a day (TID) | SUBCUTANEOUS | Status: DC
  Administered 2021-01-03: 8 [IU] via SUBCUTANEOUS
  Administered 2021-01-03 – 2021-01-04 (×2): 2 [IU] via SUBCUTANEOUS
  Administered 2021-01-04: 4 [IU] via SUBCUTANEOUS
  Administered 2021-01-05 (×2): 2 [IU] via SUBCUTANEOUS

## 2021-01-03 MED ORDER — METOPROLOL TARTRATE 12.5 MG HALF TABLET
12.5000 mg | ORAL_TABLET | Freq: Two times a day (BID) | ORAL | Status: DC
  Administered 2021-01-03 – 2021-01-06 (×7): 12.5 mg via ORAL
  Filled 2021-01-03 (×7): qty 1

## 2021-01-03 MED ORDER — POTASSIUM CHLORIDE CRYS ER 20 MEQ PO TBCR
20.0000 meq | EXTENDED_RELEASE_TABLET | Freq: Two times a day (BID) | ORAL | Status: AC
  Administered 2021-01-03 – 2021-01-05 (×6): 20 meq via ORAL
  Filled 2021-01-03 (×6): qty 1

## 2021-01-03 MED ORDER — FUROSEMIDE 10 MG/ML IJ SOLN
40.0000 mg | Freq: Once | INTRAMUSCULAR | Status: AC
  Administered 2021-01-03: 40 mg via INTRAVENOUS
  Filled 2021-01-03: qty 4

## 2021-01-03 MED ORDER — ~~LOC~~ CARDIAC SURGERY, PATIENT & FAMILY EDUCATION: Freq: Once | Status: AC

## 2021-01-03 MED ORDER — AMIODARONE HCL IN DEXTROSE 360-4.14 MG/200ML-% IV SOLN
60.0000 mg/h | INTRAVENOUS | Status: AC
  Administered 2021-01-03 (×2): 60 mg/h via INTRAVENOUS
  Filled 2021-01-03 (×2): qty 200

## 2021-01-03 MED ORDER — CHLORHEXIDINE GLUCONATE CLOTH 2 % EX PADS
6.0000 | MEDICATED_PAD | Freq: Every day | CUTANEOUS | Status: DC
  Administered 2021-01-04: 6 via TOPICAL

## 2021-01-03 MED ORDER — SENNOSIDES-DOCUSATE SODIUM 8.6-50 MG PO TABS: 1.0000 | ORAL_TABLET | Freq: Two times a day (BID) | ORAL | Status: DC | PRN

## 2021-01-03 NOTE — Progress Notes (Signed)
2 Days Post-Op Procedure(s) (LRB): CORONARY ARTERY BYPASS GRAFTING (CABG) X FIVE USING LEFT INTERNAL MAMMARY ARTERY AND RIGHT LEG GREATER SAPHENOUS VEIN HARVESTED ENDOSCOPICALLY (N/A) TRANSESOPHAGEAL ECHOCARDIOGRAM (TEE) (N/A) Subjective: No complaints, ambulated some this am. Eating a little breakfast  Objective: Vital signs in last 24 hours: Temp:  [98.3 F (36.8 C)-99.7 F (37.6 C)] 98.8 F (37.1 C) (03/23 0440) Pulse Rate:  [79-106] 99 (03/23 0700) Cardiac Rhythm: Normal sinus rhythm (03/22 2000) Resp:  [11-27] 18 (03/23 0700) BP: (90-137)/(56-86) 127/68 (03/23 0700) SpO2:  [91 %-100 %] 98 % (03/23 0700) Arterial Line BP: (82-87)/(42-54) 87/42 (03/22 0815) Weight:  [90 kg] 90 kg (03/23 0500)  Hemodynamic parameters for last 24 hours: PAP: (34-35)/(25) 34/25 CO:  [5.6 L/min] 5.6 L/min CI:  [2.8 L/min/m2] 2.8 L/min/m2  Intake/Output from previous day: 03/22 0701 - 03/23 0700 In: 1307.8 [P.O.:240; I.V.:767.8; IV Piggyback:300] Out: 2152 [Urine:2042; Chest Tube:110] Intake/Output this shift: No intake/output data recorded.  General appearance: alert and cooperative Neurologic: intact Heart: regular rate and rhythm Lungs: diminished breath sounds bibasilar Extremities: edema mild Wound: dressings dry  Lab Results: Recent Labs    01/02/21 1704 01/03/21 0412  WBC 13.1* 16.4*  HGB 9.1* 9.9*  HCT 28.0* 30.2*  PLT 213 222   BMET:  Recent Labs    01/02/21 1704 01/03/21 0412  NA 134* 135  K 4.4 4.2  CL 101 100  CO2 26 25  GLUCOSE 166* 154*  BUN 21 20  CREATININE 0.62 0.71  CALCIUM 7.9* 8.3*    PT/INR:  Recent Labs    01/01/21 1416  LABPROT 16.2*  INR 1.4*   ABG    Component Value Date/Time   PHART 7.288 (L) 01/02/2021 0359   HCO3 21.4 01/02/2021 0359   TCO2 23 01/02/2021 0359   ACIDBASEDEF 5.0 (H) 01/02/2021 0359   O2SAT 98.0 01/02/2021 0359   CBG (last 3)  Recent Labs    01/02/21 1941 01/02/21 2330 01/03/21 0412  GLUCAP 195* 144* 148*    CXR: mild bibasilar atelectasis  Assessment/Plan: S/P Procedure(s) (LRB): CORONARY ARTERY BYPASS GRAFTING (CABG) X FIVE USING LEFT INTERNAL MAMMARY ARTERY AND RIGHT LEG GREATER SAPHENOUS VEIN HARVESTED ENDOSCOPICALLY (N/A) TRANSESOPHAGEAL ECHOCARDIOGRAM (TEE) (N/A)  POD 2 Hemodynamically stable in sinus rhythm. BP is better and off neo. Will decrease midodrine to 5 tid. Start low dose Lopressor for rate control.  Volume excess: wt is 6 lbs over preop. Start diuresis.  DM: glucose under good control. Continue levemir and SSI.  DC foley and sleeve.  Transfer to 4E and continue IS, ambulation.     LOS: 2 days    Alleen Borne 01/03/2021

## 2021-01-03 NOTE — Progress Notes (Signed)
CARDIAC REHAB PHASE I   PRE:  Rate/Rhythm: 90 SR  BP:  Sitting: 93/64      SaO2: 95 RA  MODE:  Ambulation: 470 ft   POST:  Rate/Rhythm: 99 SR  BP:  Sitting: 125/78    SaO2: 94 RA   Pt ambulated 470ft in hallway standby assist with EVA. Pt took several short standing rest breaks c/o lightheadedness, but admits to staring at floor during ambulation. Encouraged pt to look ahead when walking. Pt helped to BR than returned to recliner. Pt denies CP or SOB. Encouraged continued ambulation and IS use. Will continue to follow.  [id number] Reynold Bowen, RN BSN 01/03/2021 2:45 PM

## 2021-01-03 NOTE — Progress Notes (Signed)
T[facility] BRIEF SICU PROGRESS NOTE  2 Days Post-Op  S/P Procedure(s) (LRB): CORONARY ARTERY BYPASS GRAFTING (CABG) X FIVE USING LEFT INTERNAL MAMMARY ARTERY AND RIGHT LEG GREATER SAPHENOUS VEIN HARVESTED ENDOSCOPICALLY (N/A) TRANSESOPHAGEAL ECHOCARDIOGRAM (TEE) (N/A)   Just went into Afib w/ HR 100 BP stable Otherwise doing well  Plan: Will start Amiodarone  Purcell Nails, MD 01/03/2021 4:22 PM

## 2021-01-03 NOTE — Plan of Care (Signed)

## 2021-01-03 NOTE — Progress Notes (Signed)
Patient into a fib 90s-110s. Orders received for amiodarone gtt and bolus. Cornelius Moras MD and Laneta Simmers MD aware. Medication will be given through peripheral IV since introducer discontinued this morning. Patient assisted back to bed and epicardial pacemaker attached to patient at backup rate 50.

## 2021-01-03 NOTE — Progress Notes (Signed)
  Amiodarone Drug - Drug Interaction Consult Note  Recommendations: No medication changes needed for drug interactions  Amiodarone is metabolized by the cytochrome P450 system and therefore has the potential to cause many drug interactions. Amiodarone has an average plasma half-life of 50 days (range 20 to 100 days).   There is potential for drug interactions to occur several weeks or months after stopping treatment and the onset of drug interactions may be slow after initiating amiodarone.   [x]  Statins: Increased risk of myopathy. Simvastatin- restrict dose to 20mg  daily. Other statins: counsel patients to report any muscle pain or weakness immediately.   Currently  rosuvastatin  []  Anticoagulants: Amiodarone can increase anticoagulant effect. Consider warfarin dose reduction. Patients should be monitored closely and the dose of anticoagulant altered accordingly, remembering that amiodarone levels take several weeks to stabilize.  []  Antiepileptics: Amiodarone can increase plasma concentration of phenytoin, the dose should be reduced. Note that small changes in phenytoin dose can result in large changes in levels. Monitor patient and counsel on signs of toxicity.  [x]  Beta blockers: increased risk of bradycardia, AV block and myocardial depression. Sotalol - avoid concomitant use.   Metoprolol no change needed  []   Calcium channel blockers (diltiazem and verapamil): increased risk of bradycardia, AV block and myocardial depression.  []   Cyclosporine: Amiodarone increases levels of cyclosporine. Reduced dose of cyclosporine is recommended.  []  Digoxin dose should be halved when amiodarone is started.  []  Diuretics: increased risk of cardiotoxicity if hypokalemia occurs.  []  Oral hypoglycemic agents (glyburide, glipizide, glimepiride): increased risk of hypoglycemia. Patient's glucose levels should be monitored closely when initiating amiodarone therapy.   []  Drugs that prolong the QT  interval:  Torsades de pointes risk may be increased with concurrent use - avoid if possible.  Monitor QTc, also keep magnesium/potassium WNL if concurrent therapy can't be avoided. Antibiotics: e.g. fluoroquinolones, erythromycin. . Antiarrhythmics: e.g. quinidine, procainamide, disopyramide, sotalol. . Antipsychotics: e.g. phenothiazines, haloperidol.  . Lithium, tricyclic antidepressants, and methadone.    Pharm.D. CPP, BCPS Clinical Pharmacist [phone] 01/03/2021 4:42 PM

## 2021-01-04 LAB — BASIC METABOLIC PANEL
Anion gap: 9 (ref 5–15)
BUN: 21 mg/dL (ref 8–23)
CO2: 26 mmol/L (ref 22–32)
Calcium: 8.2 mg/dL — ABNORMAL LOW (ref 8.9–10.3)
Chloride: 99 mmol/L (ref 98–111)
Creatinine, Ser: 0.62 mg/dL (ref 0.61–1.24)
GFR, Estimated: >60 mL/min (ref >60)
Glucose, Bld: 106 mg/dL — ABNORMAL HIGH (ref 70–99)
Potassium: 4.4 mmol/L (ref 3.5–5.1)
Sodium: 134 mmol/L — ABNORMAL LOW (ref 135–145)

## 2021-01-04 LAB — GLUCOSE, CAPILLARY
Glucose-Capillary: 98 mg/dL (ref 70–99)
Glucose-Capillary: 180 mg/dL — ABNORMAL HIGH (ref 70–99)
Glucose-Capillary: 165 mg/dL — ABNORMAL HIGH (ref 70–99)
Glucose-Capillary: 138 mg/dL — ABNORMAL HIGH (ref 70–99)
Glucose-Capillary: 115 mg/dL — ABNORMAL HIGH (ref 70–99)

## 2021-01-04 MED ORDER — ASPIRIN 325 MG PO TABS
325.0000 mg | ORAL_TABLET | Freq: Every day | ORAL | Status: DC
  Administered 2021-01-04 – 2021-01-06 (×3): 325 mg via ORAL
  Filled 2021-01-04 (×3): qty 1

## 2021-01-04 MED ORDER — AMIODARONE HCL 200 MG PO TABS
400.0000 mg | ORAL_TABLET | Freq: Two times a day (BID) | ORAL | Status: DC
  Administered 2021-01-04 – 2021-01-06 (×5): 400 mg via ORAL
  Filled 2021-01-04 (×5): qty 2

## 2021-01-04 NOTE — Progress Notes (Signed)
CARDIAC REHAB PHASE I   PRE:  Rate/Rhythm: 92 SR  BP:  Sitting: 117/64      SaO2: 97 RA  MODE:  Ambulation: 470 ft   POST:  Rate/Rhythm: 107 ST  BP:  Sitting: in BR   Pt ambulated 420ft in hallway standby assist with EVA. Pt able to increase distance between short standing rest breaks. Dizziness and SOB improved. Pt returned to BR. BR call light within reach. RN made aware. Will continue to follow.  [id number] Reynold Bowen, RN BSN 01/04/2021 9:56 AM

## 2021-01-04 NOTE — Progress Notes (Signed)
Patient brought to 4E from 2H. Telemetry box applied, CCMD notified. Patient stated 0/10 pain scale. CHG bath complete. Patient oriented to room and staff. Call bell in reach.  Kenard Gower, RN

## 2021-01-04 NOTE — Progress Notes (Addendum)
T[facility] DAILY ICU PROGRESS NOTE                   [address]            [place] [address]          [phone]   3 Days Post-Op Procedure(s) (LRB): CORONARY ARTERY BYPASS GRAFTING (CABG) X FIVE USING LEFT INTERNAL MAMMARY ARTERY AND RIGHT LEG GREATER SAPHENOUS VEIN HARVESTED ENDOSCOPICALLY (N/A) TRANSESOPHAGEAL ECHOCARDIOGRAM (TEE) (N/A)  Total Length of Stay:  LOS: 3 days   Subjective: Patient sitting in chair. He is hungry this am. He has no specific complaint.  Objective: Vital signs in last 24 hours: Temp:  [98 F (36.7 C)-99 F (37.2 C)] 99 F (37.2 C) (03/24 0310) Pulse Rate:  [80-115] 86 (03/24 0615) Cardiac Rhythm: Normal sinus rhythm (03/23 2325) Resp:  [12-28] 19 (03/24 0615) BP: (93-139)/(60-81) 115/71 (03/24 0615) SpO2:  [90 %-98 %] 94 % (03/24 0615) Weight:  [87.9 kg] 87.9 kg (03/24 0555)  Filed Weights   01/02/21 0500 01/03/21 0500 01/04/21 0555  Weight: 88.5 kg 90 kg 87.9 kg    Weight change: -2.1 kg      Intake/Output from previous day: 03/23 0701 - 03/24 0700 In: 256.9 [P.O.:240; I.V.:16.9] Out: 2600 [Urine:2600]  Intake/Output this shift: Total I/O In: 240 [P.O.:240] Out: 725 [Urine:725]  Current Meds: Scheduled Meds: . Chlorhexidine Gluconate Cloth  6 each Topical Daily  . furosemide  40 mg Oral Daily  . insulin aspart  0-24 Units Subcutaneous TID AC & HS  . insulin detemir  20 Units Subcutaneous Daily  . mouth rinse  15 mL Mouth Rinse BID  . metoprolol tartrate  12.5 mg Oral BID  . midodrine  5 mg Oral TID WC  . pantoprazole  40 mg Oral QAC breakfast  . potassium chloride  20 mEq Oral BID  . rosuvastatin  40 mg Oral QHS  . sodium chloride flush  3 mL Intravenous Q12H   Continuous Infusions: . sodium chloride    . amiodarone 30 mg/hr (01/04/21 0307)   PRN Meds:.sodium chloride, ondansetron **OR** ondansetron (ZOFRAN) IV, senna-docusate, sodium chloride flush, traMADol  General appearance: alert, cooperative and  no distress Neurologic: intact Heart: RRR Lungs: Slightly diminished bibasilar breath sounds Abdomen: Soft, non tender, bowel sounds present Extremities: Mild LE edema. Mild ecchymosis right thigh Wound: Sternal dressing removed and sternal wound is clean and dry. Right lower extremity wound is clean and dry  Lab Results: CBC: Recent Labs    01/02/21 1704 01/03/21 0412  WBC 13.1* 16.4*  HGB 9.1* 9.9*  HCT 28.0* 30.2*  PLT 213 222   BMET:  Recent Labs    01/03/21 0412 01/04/21 0051  NA 135 134*  K 4.2 4.4  CL 100 99  CO2 25 26  GLUCOSE 154* 106*  BUN 20 21  CREATININE 0.71 0.62  CALCIUM 8.3* 8.2*    CMET: Lab Results  Component Value Date   WBC 16.4 (H) 01/03/2021   HGB 9.9 (L) 01/03/2021   HCT 30.2 (L) 01/03/2021   PLT 222 01/03/2021   GLUCOSE 106 (H) 01/04/2021   ALT 14 01/01/2021   AST 24 01/01/2021   NA 134 (L) 01/04/2021   K 4.4 01/04/2021   CL 99 01/04/2021   CREATININE 0.62 01/04/2021   BUN 21 01/04/2021   CO2 26 01/04/2021   INR 1.4 (H) 01/01/2021   HGBA1C 8.5 (H) 12/29/2020      PT/INR:  Recent Labs  01/01/21 1416  LABPROT 16.2*  INR 1.4*   Radiology: No results found.   Assessment/Plan: S/P Procedure(s) (LRB): CORONARY ARTERY BYPASS GRAFTING (CABG) X FIVE USING LEFT INTERNAL MAMMARY ARTERY AND RIGHT LEG GREATER SAPHENOUS VEIN HARVESTED ENDOSCOPICALLY (N/A) TRANSESOPHAGEAL ECHOCARDIOGRAM (TEE) (N/A)   1. CV-He went into a fib last evening. SR this am. On Amiodarone drip, Lopressor 12.5 mg bid and Midodrine 5 mg tid. Will transition to oral Amiodarone. He is on back up pacer but has NOT needed. 2. Pulmonary-on room air. Check CXR in am. Encourage incentive spirometer. 3. Volume overload-on Lasix 40 mg daily 4. DM-CBGs 180/156/221. On Insulin. He was on Insulin and Metformin prior to admission. Will restart Metformin closer/at to discharge. Pre op HGA1C 8.5. He will need close medical follow up after discharge. 5. Expected post op  blood loss anemia-Last H and H stable at 9.9 and 30.2 6. Will start ec asa 7. Remove EPW in am 8. Awaiting bed on 4E   Donielle Margaretann Loveless PA-C 01/04/2021 7:00 AM    Chart reviewed, patient examined, agree with above. Wean midodrine as BP allows.

## 2021-01-05 ENCOUNTER — Inpatient Hospital Stay (HOSPITAL_COMMUNITY): Payer: No Typology Code available for payment source

## 2021-01-05 LAB — GLUCOSE, CAPILLARY
Glucose-Capillary: 104 mg/dL — ABNORMAL HIGH (ref 70–99)
Glucose-Capillary: 156 mg/dL — ABNORMAL HIGH (ref 70–99)
Glucose-Capillary: 145 mg/dL — ABNORMAL HIGH (ref 70–99)
Glucose-Capillary: 126 mg/dL — ABNORMAL HIGH (ref 70–99)

## 2021-01-05 LAB — CBC
HCT: 28.8 % — ABNORMAL LOW (ref 39.0–52.0)
Hemoglobin: 9.6 g/dL — ABNORMAL LOW (ref 13.0–17.0)
MCH: 28.2 pg (ref 26.0–34.0)
MCHC: 33.3 g/dL (ref 30.0–36.0)
MCV: 84.5 fL (ref 80.0–100.0)
Platelets: 258 10*3/uL (ref 150–400)
RBC: 3.41 MIL/uL — ABNORMAL LOW (ref 4.22–5.81)
RDW: 13.8 % (ref 11.5–15.5)
WBC: 10.7 10*3/uL — ABNORMAL HIGH (ref 4.0–10.5)
nRBC: 0 % (ref 0.0–0.2)

## 2021-01-05 LAB — BASIC METABOLIC PANEL
Anion gap: 7 (ref 5–15)
BUN: 19 mg/dL (ref 8–23)
CO2: 29 mmol/L (ref 22–32)
Calcium: 8.5 mg/dL — ABNORMAL LOW (ref 8.9–10.3)
Chloride: 99 mmol/L (ref 98–111)
Creatinine, Ser: 0.7 mg/dL (ref 0.61–1.24)
GFR, Estimated: >60 mL/min (ref >60)
Glucose, Bld: 90 mg/dL (ref 70–99)
Potassium: 4.1 mmol/L (ref 3.5–5.1)
Sodium: 135 mmol/L (ref 135–145)

## 2021-01-05 MED ORDER — MIDODRINE HCL 5 MG PO TABS
5.0000 mg | ORAL_TABLET | Freq: Two times a day (BID) | ORAL | Status: DC
  Administered 2021-01-05 – 2021-01-06 (×3): 5 mg via ORAL
  Filled 2021-01-05 (×3): qty 1

## 2021-01-05 MED ORDER — ASPIRIN 325 MG PO TABS: 325.0000 mg | ORAL_TABLET | Freq: Every day | ORAL | Status: AC

## 2021-01-05 MED ORDER — ASPIRIN 325 MG PO TABS: 325.0000 mg | ORAL_TABLET | Freq: Every day | ORAL | Status: DC

## 2021-01-05 NOTE — Plan of Care (Signed)
  Problem: Health Behavior/Discharge Planning: Goal: Ability to manage health-related needs will improve Outcome: Progressing   Problem: Clinical Measurements: Goal: Will remain free from infection Outcome: Progressing Goal: Diagnostic test results will improve Outcome: Progressing   

## 2021-01-05 NOTE — Progress Notes (Signed)
Mobility Specialist: Progress Note   01/05/21 1208  Mobility  Activity Ambulated in hall  Level of Assistance Contact guard assist, steadying assist  Assistive Device Front wheel walker  Distance Ambulated (ft) 470 ft  Mobility Response Tolerated well  Mobility performed by Mobility specialist  $Mobility charge 1 Mobility   Pre-Mobility: 78 HR, 112/62 BP Post-Mobility: 78 HR, 115/64 BP, 100% SpO2  Pt took two attempts before being able to stand on his own from EOB. Pt stopped for a standing break due to feeling a little SOB halfway through ambulation. Pt otherwise asx. Pt back to bed after walk per RN request.   Cristal Deer Day Mobility Specialist Mobility Specialist Phone: [phone]

## 2021-01-05 NOTE — Progress Notes (Signed)
CARDIAC REHAB PHASE I   D/c education completed with pt. Pt educated on importance of site care and monitoring incisions daily. Encouraged continued IS use, walks, and sternal precautions. Pt given in-the-tube sheet along with heart healthy and diabetic diets. Reviewed restrictions and exercise guidelines. Pt requesting front wheel walker for home use, RN aware. Will send letter of interest to [facility] [place].  [id number] Reynold Bowen, RN BSN 01/05/2021 9:47 AM

## 2021-01-05 NOTE — TOC Initial Note (Signed)
Transition of Care (TOC) - Initial/Assessment Note  Donn Pierini RN, BSN Transitions of Care Unit 4E- RN Case Manager See Treatment Team for direct phone #    Patient Details  Name: Mitchell Peterson MRN: [id number] Date of Birth: December 14, 1945  Transition of Care [facility]) CM/SW Contact:    Darrold Span, RN Phone Number: 01/05/2021, 2:35 PM  Clinical Narrative:                 Pt s/p CABG from home with wife. Plan is to return home. Per cardiac rehab, pt requesting RW for home. Order has been placed. Cm spoke with pt regarding DME needs. Pt has VA benefits. Per pt he would like to go through the [place] for his DME needs- pt voiced understanding that he would not have his RW at the time of discharge as he would have to wait on the [place] to approve and deliver the RW to his home.  Per pt his primary care MD is at the [facility] in [place].   Call made to the [facility] clinic [phone])- spoke with Karoline Caldwell, per Angie the clinic will arrange RW for patient once they receive paperwork and it is reviewed by primary care provider and approved.  Order faxed to [facility] clinic- fax # [phone], RW pending VA approval and to be delivered to home once approved.   Expected Discharge Plan: Home/Self Care Barriers to Discharge: Continued Medical Work up   Patient Goals and CMS Choice Patient states their goals for this hospitalization and ongoing recovery are:: return home CMS Medicare.gov Compare Post Acute Care list provided to:: Patient Choice offered to / list presented to : Patient  Expected Discharge Plan and Services Expected Discharge Plan: Home/Self Care   Discharge Planning Services: CM Consult Post Acute Care Choice: Durable Medical Equipment Living arrangements for the past 2 months: Single Family Home                 DME Arranged: Walker rolling DME Agency: Other - Comment [facility]) Date DME Agency Contacted: 01/05/21 Time DME Agency Contacted:  1200 Representative spoke with at DME Agency: Angie            Prior Living Arrangements/Services Living arrangements for the past 2 months: Single Family Home Lives with:: Spouse Patient language and need for interpreter reviewed:: Yes Do you feel safe going back to the place where you live?: Yes      Need for Family Participation in Patient Care: Yes (Comment) Care giver support system in place?: Yes (comment) Current home services: DME Criminal Activity/Legal Involvement Pertinent to Current Situation/Hospitalization: No - Comment as needed  Activities of Daily Living      Permission Sought/Granted Permission sought to share information with : Oceanographer granted to share information with : Yes, Verbal Permission Granted     Permission granted to share info w AGENCY: [facility]        Emotional Assessment Appearance:: Appears stated age Attitude/Demeanor/Rapport: Engaged Affect (typically observed): Appropriate Orientation: : Oriented to Self,Oriented to Place,Oriented to  Time,Oriented to Situation Alcohol / Substance Use: Not Applicable    Admission diagnosis:  Coronary artery disease [I25.10] Patient Active Problem List   Diagnosis Date Noted  . 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 Coronary artery disease 01/01/2021   [facility]  [facility]   [facility] [address], [place] - [address] [place] [address] Phone: [phone] Fax: [phone]  [facility] - [place], [place] - [address] [facility] [address] [place] [address] Phone: [phone] Fax: [phone]     Social Determinants of Health (SDOH) Interventions    Readmission Risk Interventions No flowsheet data found.

## 2021-01-05 NOTE — Progress Notes (Addendum)
      [address]       [place] [address]             [phone]        4 Days Post-Op Procedure(s) (LRB): CORONARY ARTERY BYPASS GRAFTING (CABG) X FIVE USING LEFT INTERNAL MAMMARY ARTERY AND RIGHT LEG GREATER SAPHENOUS VEIN HARVESTED ENDOSCOPICALLY (N/A) TRANSESOPHAGEAL ECHOCARDIOGRAM (TEE) (N/A)  Subjective: Patient sitting in chair. He has already eaten breakfast. He had a bowel movement this am.  He has no complaints this am. He wants to walk.  Objective: Vital signs in last 24 hours: Temp:  [97.8 F (36.6 C)-99 F (37.2 C)] 99 F (37.2 C) (03/25 0314) Pulse Rate:  [75-91] 91 (03/25 0314) Cardiac Rhythm: Normal sinus rhythm (03/24 2300) Resp:  [14-22] 18 (03/25 0314) BP: (90-127)/(55-75) 117/75 (03/25 0314) SpO2:  [96 %-100 %] 97 % (03/25 0314) Weight:  [87 kg] 87 kg (03/25 0314)  Pre op weight 87.1 kg Current Weight  01/05/21 87 kg       Intake/Output from previous day: 03/24 0701 - 03/25 0700 In: 479.5 [I.V.:479.5] Out: 800 [Urine:800]   Physical Exam:  Cardiovascular: RRR Pulmonary: Slightly diminished bibasilar breath sounds Abdomen: Soft, non tender, bowel sounds present. Extremities: Mild bilateral lower extremity edema. Wounds: Clean and dry.  No erythema or signs of infection.  Lab Results: CBC: Recent Labs    01/03/21 0412 01/05/21 0212  WBC 16.4* 10.7*  HGB 9.9* 9.6*  HCT 30.2* 28.8*  PLT 222 258   BMET:  Recent Labs    01/04/21 0051 01/05/21 0212  NA 134* 135  K 4.4 4.1  CL 99 99  CO2 26 29  GLUCOSE 106* 90  BUN 21 19  CREATININE 0.62 0.70  CALCIUM 8.2* 8.5*    PT/INR:  Lab Results  Component Value Date   INR 1.4 (H) 01/01/2021   INR 1.1 12/29/2020   ABG:  INR: Will add last result for INR, ABG once components are confirmed Will add last 4 CBG results once components are confirmed  Assessment/Plan: 1. CV-He went into a fib 03/23.. SR, first degree heart block this am. On Amiodarone 400 mg bid,  Lopressor 12.5 mg bid and Midodrine 5 mg tid. Will decrease Midodrine to bid.  2. Pulmonary-on room air.  CXR appears stable (no pneumothorax, bibasilar atelectasis, and cardiomegaly). Encourage incentive spirometer. 3. Volume overload-on Lasix 40 mg daily 4. DM-CBGs 138/115/104. On Insulin. He was on Insulin and Metformin prior to admission. Will restart Metformin closer/at to discharge. Pre op HGA1C 8.5. He will need close medical follow up after discharge. 5. Expected post op blood loss anemia- H and H stable at 9.6 and 28.8 6. Remove EPW  7. Likely discharge in am   Lelon Huh [facility] 01/05/2021,7:03 AM   Chart reviewed, patient examined, agree with above. He is progressing well. BP stable and decreasing midodrine to bid today. May be able to stop at discharge tomorrow if BP remains stable. He has not had any further atrial fib on amio. Will send home on 200 bid and decrease when I see him back in the office.

## 2021-01-05 NOTE — Progress Notes (Signed)
Removed 2 epicardial wires from right.  After two attempts to remove wires from left side, called PA Erin to make aware that attempt was unsuccessful.   Pt in room resting and aware that PA will come and remove remaining wires. Marland KitchenPt aware to remain in bed with frequent vitals until seen by PA.

## 2021-01-06 LAB — GLUCOSE, CAPILLARY
Glucose-Capillary: 121 mg/dL — ABNORMAL HIGH (ref 70–99)
Glucose-Capillary: 137 mg/dL — ABNORMAL HIGH (ref 70–99)

## 2021-01-06 MED ORDER — AMIODARONE HCL 200 MG PO TABS: 200.0000 mg | ORAL_TABLET | Freq: Two times a day (BID) | ORAL | 1 refills | Status: DC

## 2021-01-06 MED ORDER — TRAMADOL HCL 50 MG PO TABS: 50.0000 mg | ORAL_TABLET | ORAL | 0 refills | Status: AC | PRN

## 2021-01-06 MED ORDER — METOPROLOL TARTRATE 25 MG PO TABS: 12.5000 mg | ORAL_TABLET | Freq: Two times a day (BID) | ORAL | 3 refills | Status: DC

## 2021-01-06 NOTE — Progress Notes (Signed)
Patient d/c from unit. Discharge instructions provided and education completed.   Brooke Pace, RN 01/06/21 12:40 PM

## 2021-01-06 NOTE — Progress Notes (Signed)
      [address]       [place] [address]             [phone]       5 Days Post-Op Procedure(s) (LRB): CORONARY ARTERY BYPASS GRAFTING (CABG) X FIVE USING LEFT INTERNAL MAMMARY ARTERY AND RIGHT LEG GREATER SAPHENOUS VEIN HARVESTED ENDOSCOPICALLY (N/A) TRANSESOPHAGEAL ECHOCARDIOGRAM (TEE) (N/A)   Subjective:  No new complaints.  Just finished breakfast.  Happy to be going home.  Objective: Vital signs in last 24 hours: Temp:  [98.1 F (36.7 C)-98.8 F (37.1 C)] 98.5 F (36.9 C) (03/26 0335) Pulse Rate:  [75-86] 75 (03/26 0335) Cardiac Rhythm: Heart block (03/25 1900) Resp:  [15-20] 17 (03/26 0335) BP: (107-127)/(61-71) 122/67 (03/26 0335) SpO2:  [93 %-99 %] 93 % (03/26 0335) Weight:  [85.6 kg] 85.6 kg (03/26 0326)  Intake/Output from previous day: 03/25 0701 - 03/26 0700 In: 240 [P.O.:240] Out: -   General appearance: alert, cooperative and no distress Heart: regular rate and rhythm Lungs: clear to auscultation bilaterally Abdomen: soft, non-tender; bowel sounds normal; no masses,  no organomegaly Extremities: edema trace Wound: clean and dry  Lab Results: Recent Labs    01/05/21 0212  WBC 10.7*  HGB 9.6*  HCT 28.8*  PLT 258   BMET:  Recent Labs    01/04/21 0051 01/05/21 0212  NA 134* 135  K 4.4 4.1  CL 99 99  CO2 26 29  GLUCOSE 106* 90  BUN 21 19  CREATININE 0.62 0.70  CALCIUM 8.2* 8.5*    PT/INR: No results for input(s): LABPROT, INR in the last 72 hours. ABG    Component Value Date/Time   PHART 7.288 (L) 01/02/2021 0359   HCO3 21.4 01/02/2021 0359   TCO2 23 01/02/2021 0359   ACIDBASEDEF 5.0 (H) 01/02/2021 0359   O2SAT 98.0 01/02/2021 0359   CBG (last 3)  Recent Labs    01/05/21 1628 01/05/21 2107 01/06/21 0627  GLUCAP 145* 126* 121*    Assessment/Plan: S/P Procedure(s) (LRB): CORONARY ARTERY BYPASS GRAFTING (CABG) X FIVE USING LEFT INTERNAL MAMMARY ARTERY AND RIGHT LEG GREATER SAPHENOUS VEIN HARVESTED  ENDOSCOPICALLY (N/A) TRANSESOPHAGEAL ECHOCARDIOGRAM (TEE) (N/A)  1. CV- A.Fib, has been maintaining NSR- will continue Amiodarone at 200 mg BID, Lopressor 12.5 mg BID, will stop Midodrine as BP has been stable 2. Pulm- no acute issues, off oxygen, good use of IS 3. Renal- creatinine has been stable, to complete Lasix today, weight is below baseline 4. DM- sugars well controlled, continue home diabetic regimen 5. Dispo- patient stable, will d/c home today   LOS: 5 days    Lowella Dandy, PA-C 01/06/2021

## 2021-01-09 ENCOUNTER — Other Ambulatory Visit: Payer: Self-pay | Admitting: *Deleted

## 2021-01-09 ENCOUNTER — Telehealth: Payer: Self-pay

## 2021-01-09 MED ORDER — METOPROLOL TARTRATE 25 MG PO TABS: 12.5000 mg | ORAL_TABLET | Freq: Two times a day (BID) | ORAL | 3 refills | Status: AC

## 2021-01-09 MED ORDER — AMIODARONE HCL 200 MG PO TABS: 200.0000 mg | ORAL_TABLET | Freq: Two times a day (BID) | ORAL | 1 refills | Status: AC

## 2021-01-09 NOTE — Telephone Encounter (Signed)
Patient's wife contacted the office concerned about a "knot" that is at the top of patient's sternal incision. He is s/p CABG x5 with Dr. Laneta Simmers 01/01/21. She stated that the incision itself looks great and no signs or symptoms noted.  Advised that this is normal and not to be concerned about.  She acknowledged receipt.

## 2021-01-10 ENCOUNTER — Telehealth (HOSPITAL_COMMUNITY): Payer: Self-pay

## 2021-01-10 NOTE — Telephone Encounter (Signed)
Per phase 1, fax demographic info over because pt is interested in the cardiac rehab program at [facility] cardiac rehab.

## 2021-01-18 ENCOUNTER — Other Ambulatory Visit: Payer: Self-pay

## 2021-01-18 ENCOUNTER — Ambulatory Visit (INDEPENDENT_AMBULATORY_CARE_PROVIDER_SITE_OTHER): Payer: Self-pay | Admitting: *Deleted

## 2021-01-18 DIAGNOSIS — Z4802 Encounter for removal of sutures: Secondary | ICD-10-CM

## 2021-01-18 NOTE — Progress Notes (Signed)
Patient arrived for nurse visit to remove sutures post-CABG 01/03/21 by Dr. Cliffton Asters.  Four sutures removed with no signs or symptoms of infection noted.  All four incisions well approximated.  Patient tolerated suture removal well.  Patient instructed to keep the incision site clean and dry. Patient acknowledged instructions given.  All questions answered.

## 2021-02-05 ENCOUNTER — Other Ambulatory Visit: Payer: Self-pay | Admitting: Surgery

## 2021-02-05 DIAGNOSIS — Z951 Presence of aortocoronary bypass graft: Secondary | ICD-10-CM

## 2021-02-07 ENCOUNTER — Other Ambulatory Visit: Payer: Self-pay

## 2021-02-07 ENCOUNTER — Encounter: Payer: Self-pay | Admitting: Surgery

## 2021-02-07 ENCOUNTER — Ambulatory Visit (INDEPENDENT_AMBULATORY_CARE_PROVIDER_SITE_OTHER): Payer: Self-pay | Admitting: Surgery

## 2021-02-07 ENCOUNTER — Ambulatory Visit
Admission: RE | Admit: 2021-02-07 | Discharge: 2021-02-07 | Disposition: A | Discharge status: Home / Self Care | Payer: No Typology Code available for payment source | Source: Ambulatory Visit | Attending: Surgery | Admitting: Surgery

## 2021-02-07 VITALS — BP 109/72 | HR 69 | Resp 20 | Ht 68.0 in | Wt 185.0 lb

## 2021-02-07 DIAGNOSIS — Z951 Presence of aortocoronary bypass graft: Secondary | ICD-10-CM

## 2021-02-07 DIAGNOSIS — I2510 Atherosclerotic heart disease of native coronary artery without angina pectoris: Secondary | ICD-10-CM

## 2021-02-09 NOTE — Progress Notes (Signed)
HPI: Patient returns for routine postoperative follow-up having undergone coronary bypass graft surgery x5 on 01/01/2021. The patient's early postoperative recovery while in the hospital was notable for development of postoperative atrial fibrillation converted with amiodarone. Since hospital discharge the patient reports that he has been feeling well.  He is walking daily without chest pain or shortness of breath.   Current Outpatient Medications  Medication Sig Dispense Refill  . amiodarone (PACERONE) 200 MG tablet Take 1 tablet (200 mg total) by mouth 2 (two) times daily. 60 tablet 1  . aspirin 325 MG tablet Take 1 tablet (325 mg total) by mouth daily.    . Cholecalciferol (VITAMIN D3 SUPER STRENGTH) 50 MCG (2000 UT) CAPS Take 2,000 Units by mouth daily.    . empagliflozin (JARDIANCE) 25 MG TABS tablet Take 25 mg by mouth daily.    Marland Kitchen ezetimibe (ZETIA) 10 MG tablet Take 10 mg by mouth daily.    . insulin aspart protamine- aspart (NOVOLOG MIX 70/30) (70-30) 100 UNIT/ML injection Inject 13 Units into the skin 3 (three) times daily.    . insulin glargine (LANTUS) 100 UNIT/ML injection Inject 53 Units into the skin daily. Every AM    . metFORMIN (GLUCOPHAGE) 1000 MG tablet Take 1,000 mg by mouth 2 (two) times daily with a meal.    . metoprolol tartrate (LOPRESSOR) 25 MG tablet Take 0.5 tablets (12.5 mg total) by mouth 2 (two) times daily. 60 tablet 3  . omeprazole (PRILOSEC) 20 MG capsule Take 20 mg by mouth daily.    . rosuvastatin (CRESTOR) 40 MG tablet Take 40 mg by mouth at bedtime.    . traMADol (ULTRAM) 50 MG tablet Take 1 tablet (50 mg total) by mouth every 4 (four) hours as needed for moderate pain. 30 tablet 0   No current facility-administered medications for this visit.    Physical Exam: BP 109/72   Pulse 69   Resp 20   Ht 5\' 8"  (1.727 m)   Wt 185 lb (83.9 kg)   SpO2 98% Comment: RA  BMI 28.13 kg/m  He looks well. Cardiac exam shows a regular rate and rhythm with  normal heart sounds. Lungs are clear. The chest incision is healing well and sternum is stable. His leg incisions are healing well and there is no peripheral edema.  Diagnostic Tests:  Narrative & Impression  CLINICAL DATA:  CABG 5 weeks ago.  Left-sided chest pain.  EXAM: CHEST - 2 VIEW  COMPARISON:  01/05/2021  FINDINGS: Previous median sternotomy and CABG. Heart size is normal. Right chest is clear. Minimal linear scarring or atelectasis in the left lower lung. No evidence of heart failure or effusion. Bony structures otherwise unremarkable.  IMPRESSION: No active disease. Previous CABG.   Electronically Signed   By: 01/07/2021 M.D.   On: 02/07/2021 10:26      Impression:  Overall he is doing well 5 weeks following coronary bypass surgery.  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  I encouraged him to continue walking as much as possible.  He is planning on participating in cardiac rehab.  He appears to be maintaining sinus rhythm and I think his amiodarone could be discontinued when his prescription completes.  Plan:  He will continue to follow-up with his PCP at the [facility] and [facility]. [facility] for his cardiology care.  He will return to see me  if he has any problems with his incisions.   Gaye Pollack, MD [facility] [phone]

## 2021-03-14 DEATH — deceased

## 2021-11-15 IMAGING — DX DG CHEST 2V
2 series · 2 of 2 positions shown · non-contrast
Comparison: 01/05/2021

CLINICAL DATA: CABG 5 weeks ago.  Left-sided chest pain.

EXAM:
CHEST - 2 VIEW

[dg chest 2 view (1 of 2)]
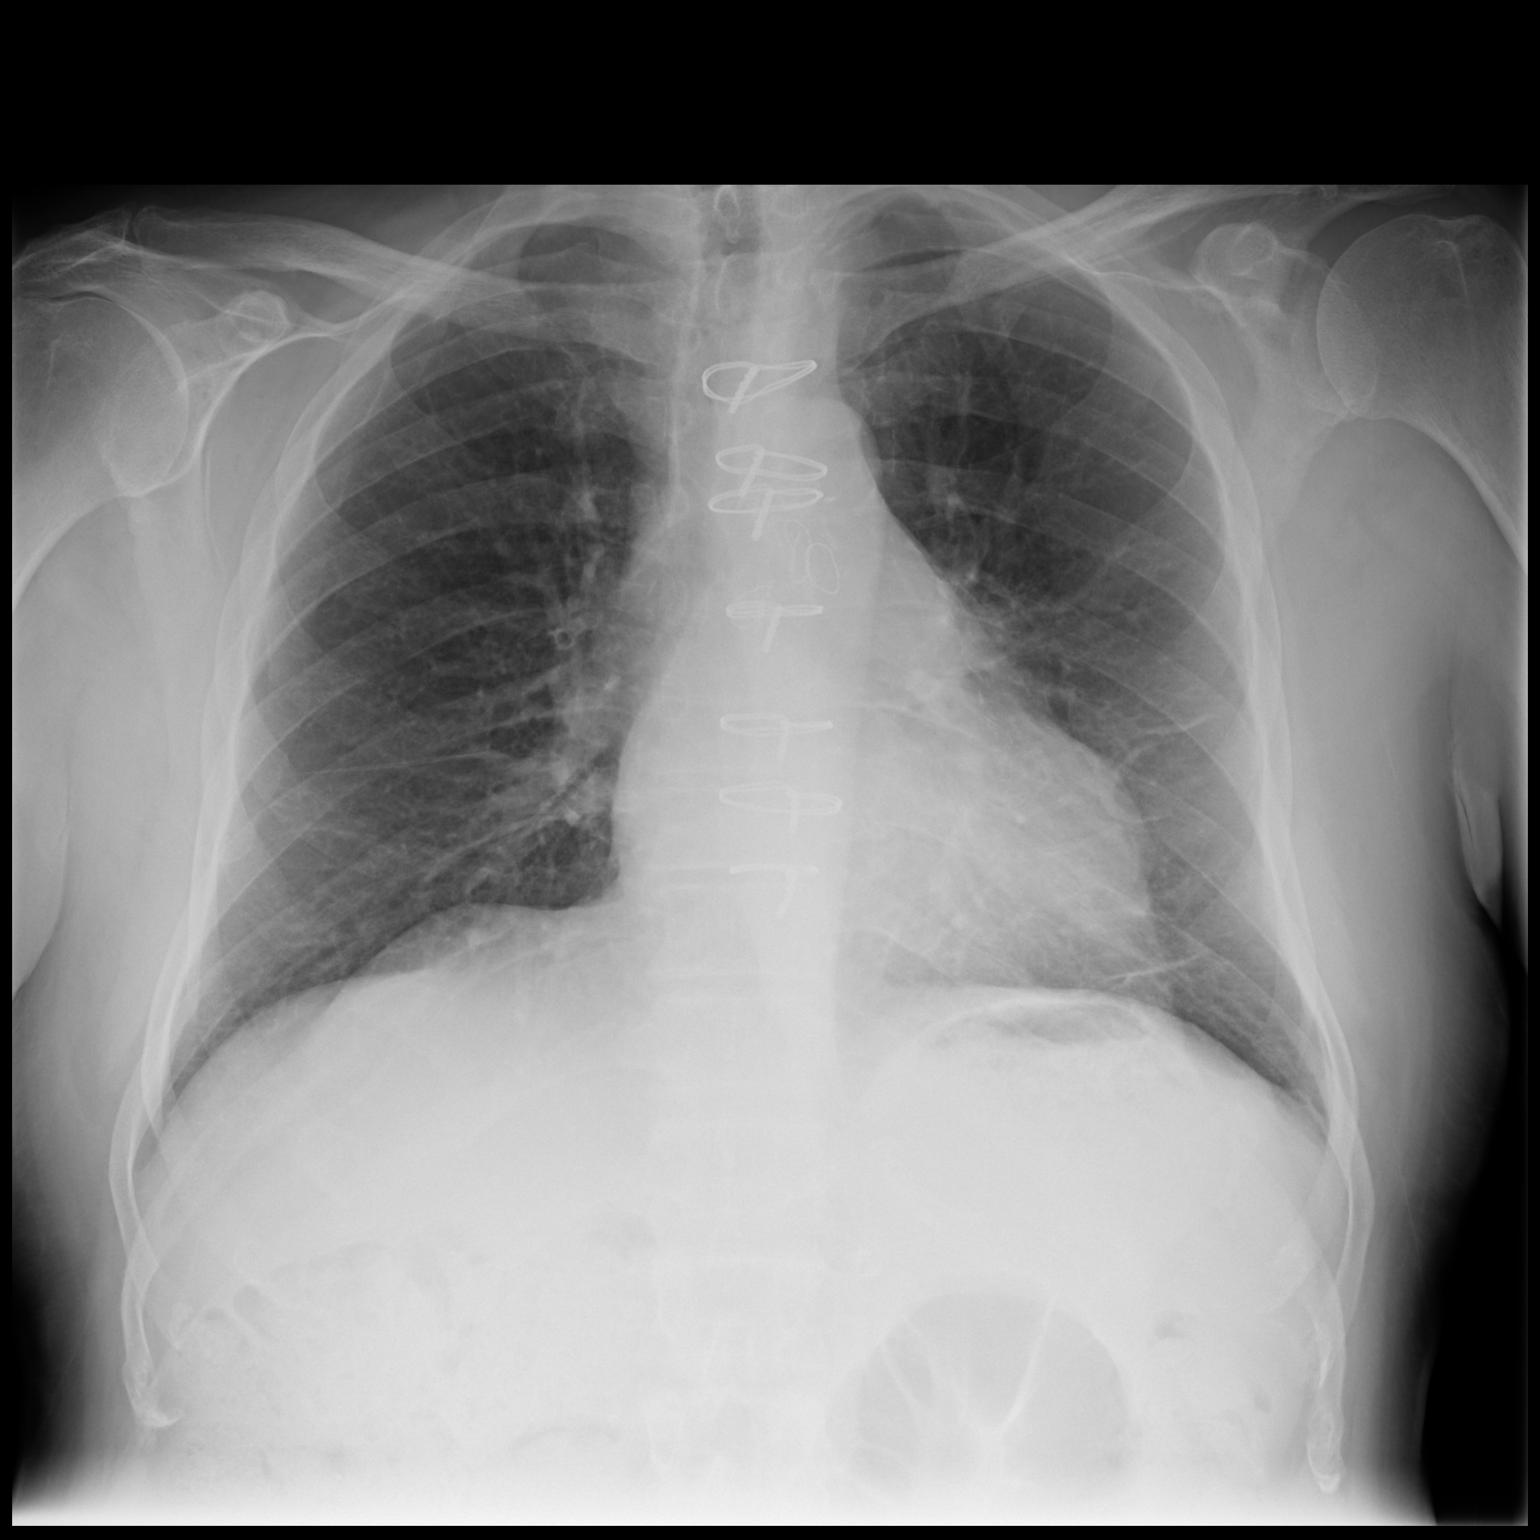

[dg chest 2 view (2 of 2)]
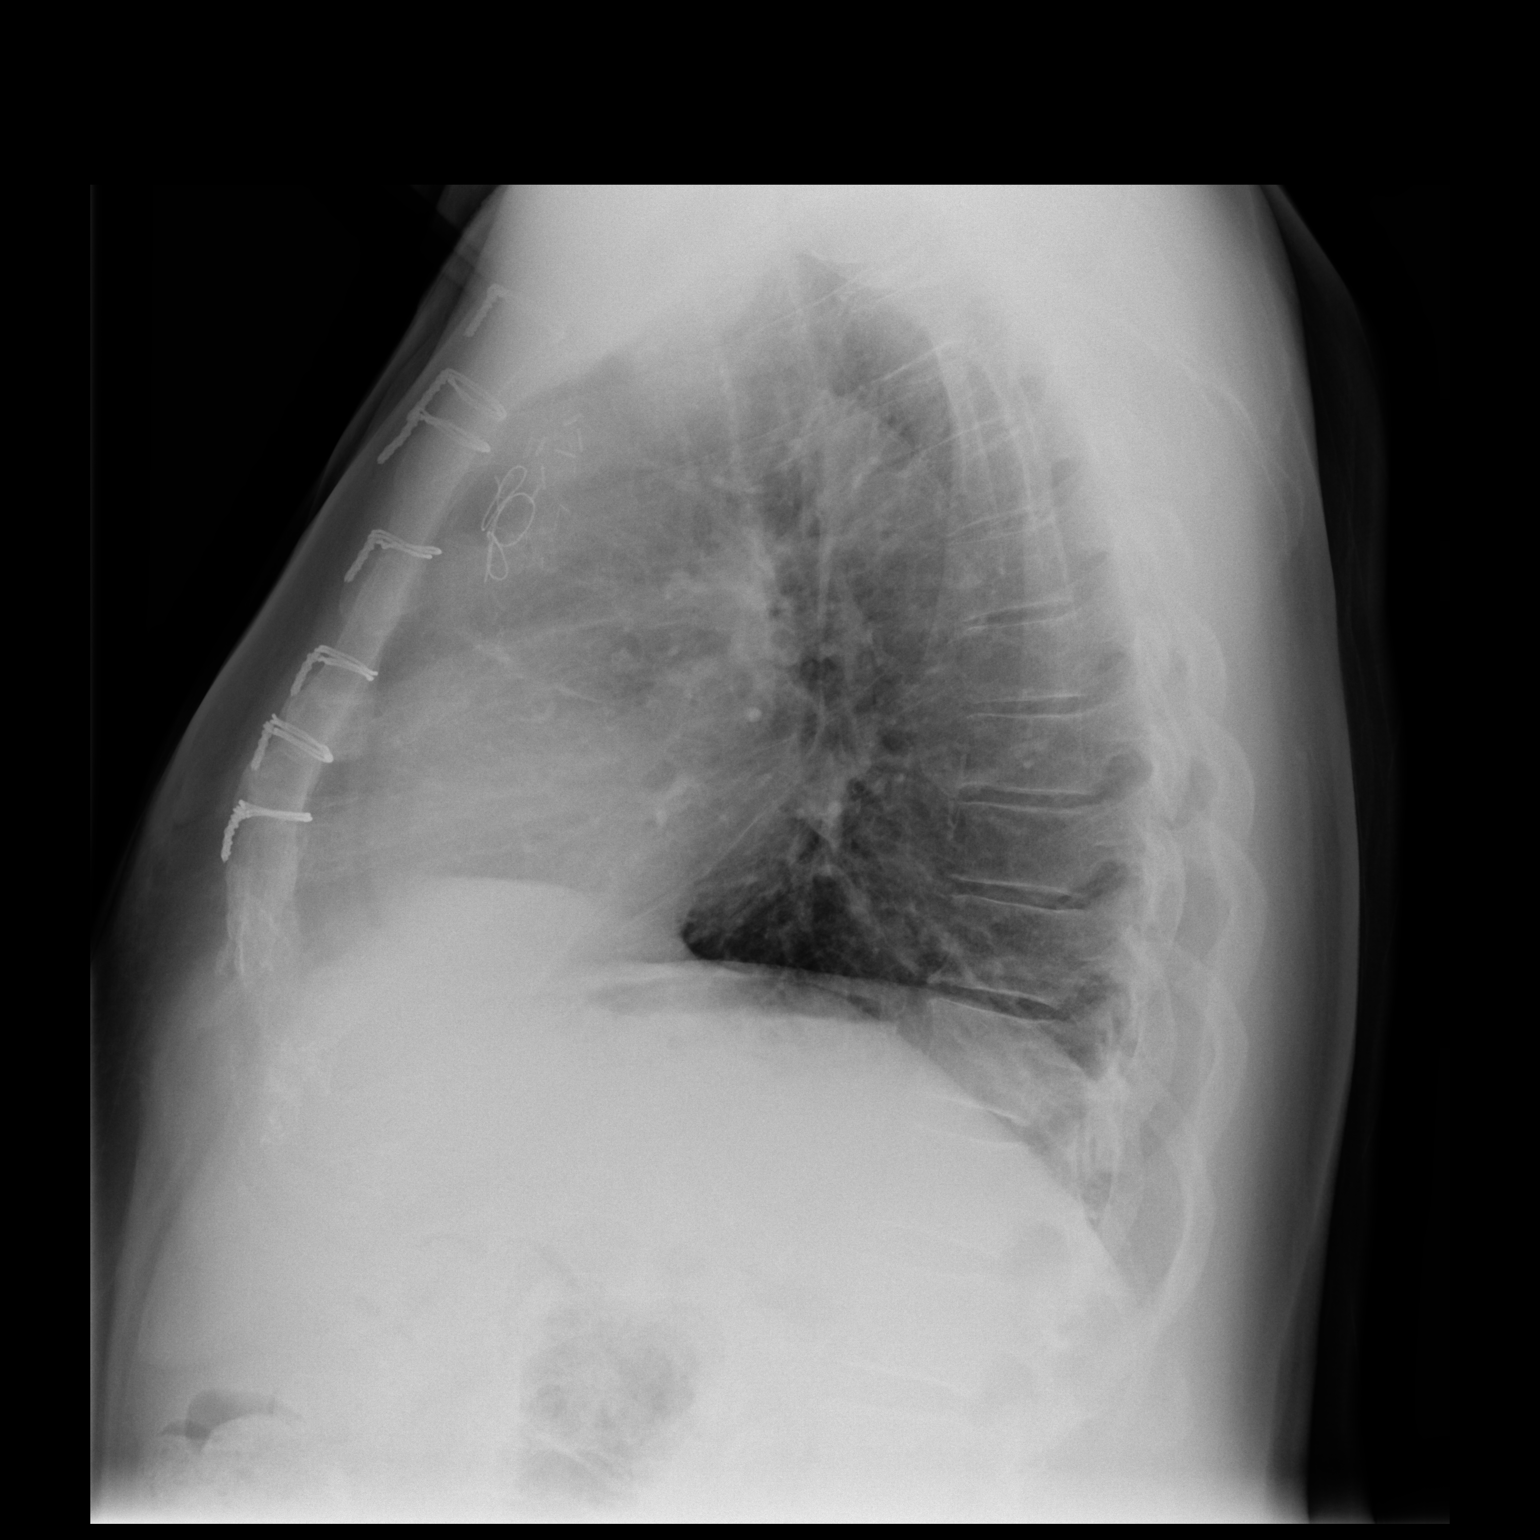

[2 of 2 positions shown; findings below may reference images not displayed]

FINDINGS: Previous median sternotomy and CABG. Heart size is normal. Right
chest is clear. Minimal linear scarring or atelectasis in the left
lower lung. No evidence of heart failure or effusion. Bony
structures otherwise unremarkable.
IMPRESSION: No active disease. Previous CABG.
